# Patient Record
Sex: Female | Born: 1980 | Race: Black or African American | Hispanic: No | Marital: Married | Smoking: Heavy tobacco smoker
Health system: Southern US, Community
[De-identification: ages and names within clinical notes are randomized; demographics above are authoritative.]

## PROBLEM LIST (undated history)

## (undated) ENCOUNTER — Inpatient Hospital Stay (HOSPITAL_COMMUNITY): Payer: Self-pay

## (undated) DIAGNOSIS — O24419 Gestational diabetes mellitus in pregnancy, unspecified control: Secondary | ICD-10-CM

## (undated) DIAGNOSIS — Z789 Other specified health status: Secondary | ICD-10-CM

## (undated) DIAGNOSIS — B999 Unspecified infectious disease: Secondary | ICD-10-CM

## (undated) DIAGNOSIS — I499 Cardiac arrhythmia, unspecified: Secondary | ICD-10-CM

## (undated) DIAGNOSIS — N739 Female pelvic inflammatory disease, unspecified: Secondary | ICD-10-CM

## (undated) HISTORY — PX: TONSILLECTOMY: SUR1361

---

## 2011-08-07 ENCOUNTER — Emergency Department (HOSPITAL_COMMUNITY)
Admission: EM | Admit: 2011-08-07 | Discharge: 2011-08-07 | Disposition: A | Discharge status: Home / Self Care | Payer: Self-pay | Attending: Emergency Medicine | Admitting: Emergency Medicine

## 2011-08-07 ENCOUNTER — Encounter (HOSPITAL_COMMUNITY): Payer: Self-pay | Admitting: Emergency Medicine

## 2011-08-07 ENCOUNTER — Emergency Department (HOSPITAL_COMMUNITY): Payer: Self-pay

## 2011-08-07 DIAGNOSIS — O200 Threatened abortion: Secondary | ICD-10-CM | POA: Insufficient documentation

## 2011-08-07 DIAGNOSIS — R10819 Abdominal tenderness, unspecified site: Secondary | ICD-10-CM | POA: Insufficient documentation

## 2011-08-07 DIAGNOSIS — R109 Unspecified abdominal pain: Secondary | ICD-10-CM | POA: Insufficient documentation

## 2011-08-07 LAB — CBC WITH DIFFERENTIAL/PLATELET
Basophils Absolute: 0 10*3/uL (ref 0.0–0.1)
Basophils Relative: 0 % (ref 0–1)
Eosinophils Absolute: 0.2 10*3/uL (ref 0.0–0.7)
Eosinophils Relative: 2 % (ref 0–5)
HCT: 38.8 % (ref 36.0–46.0)
Hemoglobin: 13.4 g/dL (ref 12.0–15.0)
MCH: 31 pg (ref 26.0–34.0)
MCHC: 34.5 g/dL (ref 30.0–36.0)
MCV: 89.8 fL (ref 78.0–100.0)
Monocytes Absolute: 0.9 10*3/uL (ref 0.1–1.0)
Monocytes Relative: 8 % (ref 3–12)
RDW: 13.1 % (ref 11.5–15.5)

## 2011-08-07 LAB — URINALYSIS, ROUTINE W REFLEX MICROSCOPIC
Bilirubin Urine: NEGATIVE
Ketones, ur: 15 mg/dL — AB
Nitrite: NEGATIVE
Protein, ur: 30 mg/dL — AB
pH: 6.5 (ref 5.0–8.0)

## 2011-08-07 LAB — ABO/RH: ABO/RH(D): O POS

## 2011-08-07 LAB — URINE MICROSCOPIC-ADD ON

## 2011-08-07 LAB — BASIC METABOLIC PANEL
BUN: 8 mg/dL (ref 6–23)
Calcium: 8.8 mg/dL (ref 8.4–10.5)
Chloride: 101 mEq/L (ref 96–112)
Creatinine, Ser: 0.52 mg/dL (ref 0.50–1.10)
GFR calc Af Amer: >90 mL/min (ref >90)

## 2011-08-07 MED ORDER — SODIUM CHLORIDE 0.9 % IV BOLUS (SEPSIS)
1000.0000 mL | Freq: Once | INTRAVENOUS | Status: AC
  Administered 2011-08-07: 1000 mL via INTRAVENOUS

## 2011-08-07 MED ORDER — ONDANSETRON HCL 4 MG/2ML IJ SOLN
4.0000 mg | Freq: Once | INTRAMUSCULAR | Status: AC
  Administered 2011-08-07: 4 mg via INTRAVENOUS
  Filled 2011-08-07: qty 2

## 2011-08-07 MED ORDER — ACETAMINOPHEN-CODEINE #3 300-30 MG PO TABS: 1.0000 | ORAL_TABLET | Freq: Four times a day (QID) | ORAL | Status: DC | PRN

## 2011-08-07 MED ORDER — MORPHINE SULFATE 4 MG/ML IJ SOLN
4.0000 mg | Freq: Once | INTRAMUSCULAR | Status: AC
  Administered 2011-08-07: 4 mg via INTRAVENOUS
  Filled 2011-08-07: qty 1

## 2011-08-07 NOTE — ED Notes (Addendum)
Pt states she is having severe menstrual cramps. States large clots passing the past two days.  States she's been bleeding for the last 3 weeks. Has hx of endometriosis. States she took 800mg  ibuprofen and 1000mg  tylenol @ 0400. Pt tearful, rating pain 10/10 at present. Denies nausea, vomiting.

## 2011-08-07 NOTE — ED Notes (Signed)
Pt returned from ultrasound

## 2011-08-07 NOTE — ED Provider Notes (Signed)
Medical screening examination/treatment/procedure(s) were performed by non-physician practitioner and as supervising physician I was immediately available for consultation/collaboration.   Loren Racer, MD 08/07/11 [phone]

## 2011-08-07 NOTE — ED Provider Notes (Signed)
History     CSN: [id number]  Arrival date & time 08/07/11  5621   First MD Initiated Contact with Patient 08/07/11 0805      Chief Complaint  Patient presents with  . Abdominal Cramping    (Consider location/radiation/quality/duration/timing/severity/associated sxs/prior treatment) HPI Comments: Patient presents with abdominal pain which began this morning and is severe in nature. It is located to the suprapubic area and radiates across the lower abdomen. She has a history of endometriosis and menstrual cramps and states this feels somewhat similar but is considerably more severe in nature. She has irregular menses and began to have spotting on June 19 which has been persistent with heavy bleeding and passing of clots for the past 2-3 days. She believes that her prior menses was likely in April. She has had slight associated nausea without vomiting. Reports decreased appetite since yesterday. Denies changes in her bowel movements. Last vomiting was this morning and was normal for her. Denies urinary symptoms. No fever/chills.  Patient is a 31 y.o. female presenting with abdominal pain. The history is provided by the patient.  Abdominal Pain The primary symptoms of the illness include abdominal pain, nausea and vaginal bleeding. The primary symptoms of the illness do not include fever, fatigue, shortness of breath, vomiting, diarrhea, hematemesis, hematochezia, dysuria or vaginal discharge. The current episode started 3 to 5 hours ago. The onset of the illness was gradual. The problem has not changed since onset. The abdominal pain is located in the suprapubic region. The abdominal pain does not radiate.  Vaginal bleeding was first noticed more than 2 days ago. Vaginal bleeding is unchanged since it began. The quantity of blood was typical of menses.  The patient states that she believes she is currently not pregnant. The patient has not had a change in bowel habit. Additional symptoms associated  with the illness include anorexia. Symptoms associated with the illness do not include chills or back pain.    History reviewed. No pertinent past medical history.  Past Surgical History  Procedure Date  . Tonsillectomy     History reviewed. No pertinent family history.  History  Substance Use Topics  . Smoking status: Current Everyday Smoker  . Smokeless tobacco: Not on file  . Alcohol Use: Yes     occasionally    OB History    Grav Para Term Preterm Abortions TAB SAB Ect Mult Living                  Review of Systems  Constitutional: Negative for fever, chills and fatigue.  Respiratory: Negative for shortness of breath.   Cardiovascular: Negative for chest pain.  Gastrointestinal: Positive for nausea, abdominal pain and anorexia. Negative for vomiting, diarrhea, hematochezia and hematemesis.  Genitourinary: Positive for vaginal bleeding. Negative for dysuria and vaginal discharge.  Musculoskeletal: Negative for back pain.  All other systems reviewed and are negative.    Allergies  Review of patient's allergies indicates no known allergies.  Home Medications   Current Outpatient Rx  Name Route Sig Dispense Refill  . PAIN RELIEVER EXTRA STRENGTH PO Oral Take 1,000 mg by mouth every 6 (six) hours as needed. For pain    . IBUPROFEN 200 MG PO TABS Oral Take 800 mg by mouth every 8 (eight) hours as needed. For pain      LMP 07/18/2011  Physical Exam  Nursing note and vitals reviewed. Constitutional: She appears well-developed and well-nourished. No distress.       Tearful, uncomfortable appearing  HENT:  Head: Normocephalic and atraumatic.  Eyes:       Normal appearance  Neck: Normal range of motion.  Cardiovascular: Normal rate, regular rhythm and normal heart sounds.   Pulmonary/Chest: Effort normal and breath sounds normal. She exhibits no tenderness.  Abdominal: Soft.       Tender to palpation across lower/suprapubic abdomen, most pronounced on R side. No  guard or rebound. Negative Rovsing, jar testing. Questionably positive obturator.  Musculoskeletal: Normal range of motion.  Neurological: She is alert.  Skin: Skin is warm and dry. She is not diaphoretic.  Psychiatric: She has a normal mood and affect.    ED Course  Procedures (including critical care time)  Labs Reviewed  CBC WITH DIFFERENTIAL - Abnormal; Notable for the following:    WBC 11.5 (*)     Neutro Abs 8.8 (*)     All other components within normal limits  BASIC METABOLIC PANEL - Abnormal; Notable for the following:    Glucose, Bld 102 (*)     All other components within normal limits  URINALYSIS, ROUTINE W REFLEX MICROSCOPIC - Abnormal; Notable for the following:    APPearance CLOUDY (*)     Hgb urine dipstick LARGE (*)     Ketones, ur 15 (*)     Protein, ur 30 (*)     Leukocytes, UA LARGE (*)     All other components within normal limits  POCT PREGNANCY, URINE - Abnormal; Notable for the following:    Preg Test, Ur POSITIVE (*)     All other components within normal limits  HCG, QUANTITATIVE, PREGNANCY - Abnormal; Notable for the following:    hCG, Beta Chain, Quant, S 345 (*)     All other components within normal limits  URINE MICROSCOPIC-ADD ON - Abnormal; Notable for the following:    Squamous Epithelial / LPF FEW (*)     Bacteria, UA FEW (*)     All other components within normal limits  ABO/RH   US Ob Comp Less 14 Wks  08/07/2011  *RADIOLOGY REPORT*  Clinical Data: Abdominal pain and vaginal bleeding.  Possibly 12 weeks 1 day pregnant.  The patient is unsure of her last menstrual period.  Quantitative beta HCG 345.  OBSTETRIC <14 WK [place] AND TRANSVAGINAL OB US  Technique:  Both transabdominal and transvaginal ultrasound examinations were performed for complete evaluation of the gestation as well as the maternal uterus, adnexal regions, and pelvic cul-de-sac.  Transvaginal technique was performed to assess early pregnancy.  Comparison:  None.  Intrauterine  gestational sac:  Not definitely visualized. Yolk sac: Not visualized Embryo: Not visualized Cardiac Activity: Not visualized  Maternal uterus/adnexae: The endometrium in the lower uterine segment is thickened and heterogeneous, measuring 24.1 mm in maximum thickness.  There is an oval hypoechoic region within the thickened endometrium in this portion of the uterus.  The fundal endometrium is not as well visualized and measures approximately 15.4 mm in maximum thickness.  A trace amount of free peritoneal fluid is demonstrated.  The ovaries are poorly visualized and appear grossly normal.  No adnexal masses are seen.  IMPRESSION: Probable ongoing spontaneous abortion.  A follow-up obstetrical ultrasound is recommended in 2 weeks as well as correlation with serial quantitative beta HCG determinations.  Original Report Authenticated By: Darrol Angel, M.D.   US Ob Transvaginal  08/07/2011  *RADIOLOGY REPORT*  Clinical Data: Abdominal pain and vaginal bleeding.  Possibly 12 weeks 1 day pregnant.  The patient is unsure of  her last menstrual period.  Quantitative beta HCG 345.  OBSTETRIC <14 WK [place] AND TRANSVAGINAL OB US  Technique:  Both transabdominal and transvaginal ultrasound examinations were performed for complete evaluation of the gestation as well as the maternal uterus, adnexal regions, and pelvic cul-de-sac.  Transvaginal technique was performed to assess early pregnancy.  Comparison:  None.  Intrauterine gestational sac:  Not definitely visualized. Yolk sac: Not visualized Embryo: Not visualized Cardiac Activity: Not visualized  Maternal uterus/adnexae: The endometrium in the lower uterine segment is thickened and heterogeneous, measuring 24.1 mm in maximum thickness.  There is an oval hypoechoic region within the thickened endometrium in this portion of the uterus.  The fundal endometrium is not as well visualized and measures approximately 15.4 mm in maximum thickness.  A trace amount of free peritoneal  fluid is demonstrated.  The ovaries are poorly visualized and appear grossly normal.  No adnexal masses are seen.  IMPRESSION: Probable ongoing spontaneous abortion.  A follow-up obstetrical ultrasound is recommended in 2 weeks as well as correlation with serial quantitative beta HCG determinations.  Original Report Authenticated By: Darrol Angel, M.D.     1. Threatened abortion       MDM  Pt with sharp abd pain which began this AM and has not improved with ibuprofen. Believes this feels c/w cramping from menses. Pt tender to suprapubic area and RLQ. Normal vitals. She has +hcg but quant only 350. [place] does not show any obvious evidence of IUP; with pt's continued bleeding this likely represents threatened AB. She is RH+ so no need for rhogam. Remainder of labs nl. Instructed that she will need to follow up in several days to have quant repeated; [facility]. Reasons to return to ED for worsening, changing, or new abd pain discussed. Pt verbalized understanding and agreed to plan.  On repeat abd exam, abd soft, tenderness considerably improved.  Grant Fontana, PA-C 08/07/11 1339

## 2011-08-08 ENCOUNTER — Inpatient Hospital Stay (HOSPITAL_COMMUNITY)
Admission: AD | Admit: 2011-08-08 | Discharge: 2011-08-08 | Disposition: A | Discharge status: Home / Self Care | Payer: MEDICAID | Source: Ambulatory Visit | Attending: Family Medicine | Admitting: Family Medicine

## 2011-08-08 ENCOUNTER — Encounter (HOSPITAL_COMMUNITY): Payer: Self-pay

## 2011-08-08 DIAGNOSIS — O200 Threatened abortion: Secondary | ICD-10-CM | POA: Diagnosis present

## 2011-08-08 DIAGNOSIS — O030 Genital tract and pelvic infection following incomplete spontaneous abortion: Secondary | ICD-10-CM | POA: Insufficient documentation

## 2011-08-08 DIAGNOSIS — R1031 Right lower quadrant pain: Secondary | ICD-10-CM | POA: Insufficient documentation

## 2011-08-08 DIAGNOSIS — N390 Urinary tract infection, site not specified: Secondary | ICD-10-CM | POA: Diagnosis present

## 2011-08-08 LAB — URINALYSIS, ROUTINE W REFLEX MICROSCOPIC
Glucose, UA: NEGATIVE mg/dL
Ketones, ur: NEGATIVE mg/dL
Protein, ur: 30 mg/dL — AB
Urobilinogen, UA: 1 mg/dL (ref 0.0–1.0)

## 2011-08-08 LAB — WET PREP, GENITAL: Trich, Wet Prep: NONE SEEN

## 2011-08-08 LAB — URINE MICROSCOPIC-ADD ON

## 2011-08-08 MED ORDER — NITROFURANTOIN MONOHYD MACRO 100 MG PO CAPS: 100.0000 mg | ORAL_CAPSULE | ORAL | Status: DC

## 2011-08-08 MED ORDER — OXYCODONE-ACETAMINOPHEN 5-325 MG PO TABS: 1.0000 | ORAL_TABLET | Freq: Four times a day (QID) | ORAL | Status: AC | PRN

## 2011-08-08 MED ORDER — SULFAMETHOXAZOLE-TRIMETHOPRIM 800-160 MG PO TABS: 1.0000 | ORAL_TABLET | Freq: Two times a day (BID) | ORAL | Status: DC

## 2011-08-08 MED ORDER — OXYCODONE-ACETAMINOPHEN 5-325 MG PO TABS
2.0000 | ORAL_TABLET | ORAL | Status: AC
  Administered 2011-08-08: 2 via ORAL
  Filled 2011-08-08: qty 2

## 2011-08-08 NOTE — MAU Provider Note (Signed)
Kim Sullivan y.o.G1P0 @[redacted]w[redacted]d  by LMP Chief Complaint  Patient presents with  . Abdominal Pain     First Provider Initiated Contact with Patient 08/08/11 1024      SUBJECTIVE  HPI: Pt presents to MAU with continuing suprapubic and RLQ pain.  She presented to [facility] ED yesterday morning for these symptoms and had U/S and quantitative HCG indicating probable SAB.  She is having vaginal bleeding which is lighter today than yesterday, and reports irregular periods since adolescence.  She found about about pregnancy at ED yesterday.  She reports pain not well treated with Tylenol #3 she was prescribed and she has had urinary frequency for 2 days as well, without dysuria.  She denies LOF, vaginal itching/burning, h/a, dizziness, n/v, or fever/chills.    History reviewed. No pertinent past medical history. Past Surgical History  Procedure Date  . Tonsillectomy    History   Social History  . Marital Status: Single    Spouse Name: N/A    Number of Children: N/A  . Years of Education: N/A   Occupational History  . Not on file.   Social History Main Topics  . Smoking status: Current Everyday Smoker -- 0.5 packs/day  . Smokeless tobacco: Not on file  . Alcohol Use: Yes     occasionally  . Drug Use: No  . Sexually Active: Yes   Other Topics Concern  . Not on file   Social History Narrative  . No narrative on file   No current facility-administered medications on file prior to encounter.   Current Outpatient Prescriptions on File Prior to Encounter  Medication Sig Dispense Refill  . acetaminophen-codeine (TYLENOL #3) 300-30 MG per tablet Take 1 tablet by mouth every 6 (six) hours as needed for pain.  15 tablet  0   No Known Allergies  ROS: Pertinent items in HPI  OBJECTIVE Blood pressure 111/72, pulse 73, temperature 98.6 F (37 C), temperature source Oral, resp. rate 18, height 5' 10.5" (1.791 m), weight 146.058 kg (322 lb), last menstrual period 07/18/2011, SpO2  97.00%.  GENERAL: Well-developed, well-nourished female in no acute distress.  HEENT: Normocephalic, good dentition HEART: normal rate RESP: normal effort ABDOMEN: Soft, nontender EXTREMITIES: Nontender, no edema NEURO: Alert and oriented SPECULUM EXAM:Pelvic exam: Cervix pink, visually closed, without lesion, moderate amount dark red blood, no clots noted, vaginal walls and external genitalia normal Bimanual exam: Cervix 0/long/high, soft, anterior, neg CMT, uterus nontender, nonenlarged, adnexa without tenderness, enlargement, or mass  LAB RESULTS  Results for orders placed during the hospital encounter of 08/08/11 (from the past 24 hour(s))  URINALYSIS, ROUTINE W REFLEX MICROSCOPIC     Status: Abnormal   Collection Time   08/08/11 10:12 AM      Component Value Range   Color, Urine RED (*) YELLOW   APPearance HAZY (*) CLEAR   Specific Gravity, Urine 1.015  1.005 - 1.030   pH 6.5  5.0 - 8.0   Glucose, UA NEGATIVE  NEGATIVE mg/dL   Hgb urine dipstick LARGE (*) NEGATIVE   Bilirubin Urine NEGATIVE  NEGATIVE   Ketones, ur NEGATIVE  NEGATIVE mg/dL   Protein, ur 30 (*) NEGATIVE mg/dL   Urobilinogen, UA 1.0  0.0 - 1.0 mg/dL   Nitrite NEGATIVE  NEGATIVE   Leukocytes, UA MODERATE (*) NEGATIVE  URINE MICROSCOPIC-ADD ON     Status: Normal   Collection Time   08/08/11 10:12 AM      Component Value Range   Squamous Epithelial / LPF RARE  RARE   WBC, UA 3-6  <3 WBC/hpf   RBC / HPF TOO NUMEROUS TO COUNT  <3 RBC/hpf   Bacteria, UA RARE  RARE    IMAGING   ASSESSMENT Threatened SAB  Uncomplicated UTI   PLAN D/C home with bleeding precautions, infection precautions Bactrim DS BID x3 days, printed so pt can take to [facility] for lowest cost Urine sent for culture GC/Chlamydia cultures pending Return to MAU tomorrow morning after 9 am for repeat quantitative HCG  Medication List  As of 08/08/2011 12:26 PM   ASK your doctor about these medications         acetaminophen-codeine 300-30  MG per tablet   Commonly known as: TYLENOL #3   Take 1 tablet by mouth every 6 (six) hours as needed for pain.              LEFTWICH-KIRBY, Javar Eshbach 08/08/2011 12:26 PM

## 2011-08-08 NOTE — MAU Note (Signed)
Patient states she was seen at [facility] ED yesterday with a positive pregnancy test. Has continued to cramp all night with slight bleeding.

## 2011-08-09 ENCOUNTER — Inpatient Hospital Stay (HOSPITAL_COMMUNITY)
Admission: AD | Admit: 2011-08-09 | Discharge: 2011-08-09 | Disposition: A | Discharge status: Home / Self Care | Payer: Self-pay | Source: Ambulatory Visit | Attending: Obstetrics & Gynecology | Admitting: Obstetrics & Gynecology

## 2011-08-09 DIAGNOSIS — O039 Complete or unspecified spontaneous abortion without complication: Secondary | ICD-10-CM | POA: Insufficient documentation

## 2011-08-09 LAB — HCG, QUANTITATIVE, PREGNANCY: hCG, Beta Chain, Quant, S: 76 m[IU]/mL — ABNORMAL HIGH (ref <5)

## 2011-08-09 NOTE — MAU Provider Note (Signed)
  History     CSN: [id number]  Arrival date and time: 08/09/11 0959   None     No chief complaint on file.  HPI This is a 31 y.o. female at [redacted]w[redacted]d who was diagnosed with a probable SAB earlier this week. She is her for a Followup  HCG.  OB History    Grav Para Term Preterm Abortions TAB SAB Ect Mult Living   1               No past medical history on file.  Past Surgical History  Procedure Date  . Tonsillectomy     Family History  Problem Relation Age of Onset  . Heart disease Father   . Hypertension Father   . Hypertension Maternal Grandfather   . Heart disease Maternal Grandfather     History  Substance Use Topics  . Smoking status: Current Everyday Smoker -- 0.5 packs/day  . Smokeless tobacco: Not on file  . Alcohol Use: Yes     occasionally    Allergies: No Known Allergies  Prescriptions prior to admission  Medication Sig Dispense Refill  . oxyCODONE-acetaminophen (PERCOCET) 5-325 MG per tablet Take 1-2 tablets by mouth every 6 (six) hours as needed for pain.  15 tablet  0  . sulfamethoxazole-trimethoprim (BACTRIM DS,SEPTRA DS) 800-160 MG per tablet Take 1 tablet by mouth 2 (two) times daily.  6 tablet  0    ROS Cramps and bleeding. Bleeding is light  Physical Exam   Last menstrual period 07/18/2011.  Physical Exam  Constitutional: She is oriented to person, place, and time. She appears well-developed and well-nourished. No distress.  Cardiovascular: Normal rate.   Respiratory: Effort normal.  Neurological: She is alert and oriented to person, place, and time.  Skin: Skin is warm and dry.  Psychiatric: She has a normal mood and affect.  Pelvic exam not indicated.  MAU Course  Procedures  MDM Quant = 76 Results for orders placed during the hospital encounter of 08/09/11 (from the past 72 hour(s))  HCG, QUANTITATIVE, PREGNANCY     Status: Abnormal   Collection Time   08/09/11 10:10 AM      Component Value Range Comment   hCG, Beta Chain,  Quant, S 76 (*) <5 mIU/mL    Last quant 345  O +  Assessment and Plan  A:  SAB, probably complete       P:  Discussed with patient      Recommend repeat quant in one week. In clinic  [facility] 08/09/2011, 12:36 PM

## 2011-08-09 NOTE — MAU Provider Note (Signed)
Chart reviewed and agree with management and plan.  

## 2011-08-10 LAB — URINE CULTURE

## 2011-08-11 ENCOUNTER — Encounter (HOSPITAL_COMMUNITY): Payer: Self-pay | Admitting: *Deleted

## 2011-08-11 ENCOUNTER — Inpatient Hospital Stay (HOSPITAL_COMMUNITY)
Admission: AD | Admit: 2011-08-11 | Discharge: 2011-08-12 | Disposition: A | Discharge status: Home / Self Care | Payer: Self-pay | Source: Ambulatory Visit | Attending: Family Medicine | Admitting: Family Medicine

## 2011-08-11 DIAGNOSIS — N390 Urinary tract infection, site not specified: Secondary | ICD-10-CM

## 2011-08-11 DIAGNOSIS — R109 Unspecified abdominal pain: Secondary | ICD-10-CM | POA: Insufficient documentation

## 2011-08-11 DIAGNOSIS — O035 Genital tract and pelvic infection following complete or unspecified spontaneous abortion: Secondary | ICD-10-CM | POA: Insufficient documentation

## 2011-08-11 DIAGNOSIS — O039 Complete or unspecified spontaneous abortion without complication: Secondary | ICD-10-CM

## 2011-08-11 HISTORY — DX: Other specified health status: Z78.9

## 2011-08-11 LAB — URINE MICROSCOPIC-ADD ON

## 2011-08-11 LAB — URINALYSIS, ROUTINE W REFLEX MICROSCOPIC
Bilirubin Urine: NEGATIVE
Glucose, UA: 100 mg/dL — AB
Ketones, ur: NEGATIVE mg/dL
Protein, ur: 100 mg/dL — AB
Urobilinogen, UA: 2 mg/dL — ABNORMAL HIGH (ref 0.0–1.0)

## 2011-08-11 MED ORDER — KETOROLAC TROMETHAMINE 60 MG/2ML IM SOLN
60.0000 mg | Freq: Once | INTRAMUSCULAR | Status: AC
  Administered 2011-08-12: 60 mg via INTRAMUSCULAR
  Filled 2011-08-11: qty 2

## 2011-08-11 NOTE — MAU Provider Note (Signed)
History     CSN: [id number]  Arrival date and time: 08/11/11 2201   First Provider Initiated Contact with Patient 08/11/11 2323      Chief Complaint  Patient presents with  . Abdominal Cramping   HPI Kim Sullivan 31 y.o. [redacted]w[redacted]d Seen last on 08-08-11 and diagnosed with a miscarriage.  Returns today with continued vaginal bleeding and worsening abdominal pain.  Has been taking Percocet 2 tablets and has taken the 15 tablets since she was here previously.   Finished the Bactrim and still thinks she has a UTI.  Culture was inconclusive.  Will recollect UA today.  Hx of frequent UTIs as a child.  OB History    Grav Para Term Preterm Abortions TAB SAB Ect Mult Living   2 1 1  0 0 0 0 0 0 0      Past Medical History  Diagnosis Date  . No pertinent past medical history     Past Surgical History  Procedure Date  . Tonsillectomy     Family History  Problem Relation Age of Onset  . Heart disease Father   . Hypertension Father   . Hypertension Maternal Grandfather   . Heart disease Maternal Grandfather   . Other Neg Hx     History  Substance Use Topics  . Smoking status: Current Everyday Smoker -- 0.5 packs/day  . Smokeless tobacco: Not on file  . Alcohol Use: Yes     occasionally    Allergies: No Known Allergies  Prescriptions prior to admission  Medication Sig Dispense Refill  . acetaminophen-codeine (TYLENOL #3) 300-30 MG per tablet Take 1 tablet by mouth every 4 (four) hours as needed. pain      . ibuprofen (ADVIL,MOTRIN) 200 MG tablet Take 600 mg by mouth every 6 (six) hours as needed. pain      . oxyCODONE-acetaminophen (PERCOCET) 5-325 MG per tablet Take 1-2 tablets by mouth every 6 (six) hours as needed for pain.  15 tablet  0  . sulfamethoxazole-trimethoprim (BACTRIM DS,SEPTRA DS) 800-160 MG per tablet Take 1 tablet by mouth 2 (two) times daily.  6 tablet  0    Review of Systems  Constitutional: Negative for fever.  Gastrointestinal: Positive for abdominal  pain. Negative for nausea and vomiting.  Musculoskeletal: Negative for back pain.   Physical Exam   Blood pressure 120/71, pulse 76, temperature 98.3 F (36.8 C), temperature source Oral, resp. rate 20, height 5\' 11"  (1.803 m), weight 320 lb (145.151 kg), last menstrual period 07/18/2011.  Physical Exam  Nursing note and vitals reviewed. Constitutional: She is oriented to person, place, and time. She appears well-developed and well-nourished.  HENT:  Head: Normocephalic.  Eyes: EOM are normal.  Neck: Neck supple.  GI: Soft. There is tenderness. There is no rebound and no guarding.       Midline tenderness  Musculoskeletal: Normal range of motion.  Neurological: She is alert and oriented to person, place, and time.  Skin: Skin is warm and dry.  Psychiatric: She has a normal mood and affect.    MAU Course  Procedures  MDM Results for orders placed during the hospital encounter of 08/11/11 (from the past 24 hour(s))  URINALYSIS, ROUTINE W REFLEX MICROSCOPIC     Status: Abnormal   Collection Time   08/11/11 11:35 PM      Component Value Range   Color, Urine YELLOW  YELLOW   APPearance CLOUDY (*) CLEAR   Specific Gravity, Urine >1.030 (*) 1.005 - 1.030  pH 6.0  5.0 - 8.0   Glucose, UA 100 (*) NEGATIVE mg/dL   Hgb urine dipstick LARGE (*) NEGATIVE   Bilirubin Urine NEGATIVE  NEGATIVE   Ketones, ur NEGATIVE  NEGATIVE mg/dL   Protein, ur 578 (*) NEGATIVE mg/dL   Urobilinogen, UA 2.0 (*) 0.0 - 1.0 mg/dL   Nitrite POSITIVE (*) NEGATIVE   Leukocytes, UA SMALL (*) NEGATIVE  URINE MICROSCOPIC-ADD ON     Status: Abnormal   Collection Time   08/11/11 11:35 PM      Component Value Range   Squamous Epithelial / LPF FEW (*) RARE   WBC, UA 11-20  <3 WBC/hpf   RBC / HPF TOO NUMEROUS TO COUNT  <3 RBC/hpf   Bacteria, UA FEW (*) RARE   Urine culture pending  Assessment and Plan  UTI Miscarriage  Plan Will give Cipro 500 mg PO BID x 3 days for UTI Message sent to GYN clinic to  make sure she has an appointment for followup for repeat quant. Return if you develop fever, back pain or body aches. Drink at least 8 8-oz glasses of water every day. Expect to have some continued vaginal bleeding. rx Percocet one po q 4-6 hours PRN for severe pain rx Ibuprofen 600 mg PO q 4-6 hours for pain.     Erbie Arment 08/11/2011, 11:32 PM

## 2011-08-11 NOTE — MAU Note (Signed)
Was seen at [facility] and found out was pregnant. Came back in Weds due to pain and found out had uti. Returned Thurs for hormone level and found out was having miscarriage. Finished Bactrim yesterday. Pain in lower abd worse this afternoon and continues. Sharp and cramping pain.

## 2011-08-12 DIAGNOSIS — O039 Complete or unspecified spontaneous abortion without complication: Secondary | ICD-10-CM | POA: Diagnosis present

## 2011-08-12 MED ORDER — IBUPROFEN 600 MG PO TABS: 600.0000 mg | ORAL_TABLET | Freq: Four times a day (QID) | ORAL | Status: AC | PRN

## 2011-08-12 MED ORDER — CIPROFLOXACIN HCL 500 MG PO TABS: 500.0000 mg | ORAL_TABLET | Freq: Two times a day (BID) | ORAL | Status: AC

## 2011-08-12 MED ORDER — CIPROFLOXACIN HCL 500 MG PO TABS
500.0000 mg | ORAL_TABLET | Freq: Once | ORAL | Status: AC
  Administered 2011-08-12: 500 mg via ORAL
  Filled 2011-08-12: qty 1

## 2011-08-12 MED ORDER — OXYCODONE-ACETAMINOPHEN 5-325 MG PO TABS: 2.0000 | ORAL_TABLET | ORAL | Status: AC | PRN

## 2011-08-12 NOTE — MAU Provider Note (Signed)
Chart reviewed and agree with management and plan.  

## 2011-08-12 NOTE — Progress Notes (Signed)
Written and verbal d/c instructions given and understanding voiced. 

## 2011-08-14 LAB — URINE CULTURE: Colony Count: 25000

## 2011-08-17 ENCOUNTER — Other Ambulatory Visit: Payer: Self-pay

## 2011-08-17 DIAGNOSIS — O039 Complete or unspecified spontaneous abortion without complication: Secondary | ICD-10-CM

## 2012-01-30 NOTE — L&D Delivery Note (Signed)
Delivery Note At 6:08 AM a viable female was delivered via Vaginal, Spontaneous Delivery (Presentation: ; Occiput Anterior).  APGAR: 3, 9; weight 6 lb 15.1 oz (3150 g).   Placenta status: Intact, Spontaneous.  Cord: 3 vessels with the following complications: None.  Cord pH: 7.34 (venous, not enough blood for arterial)  Anesthesia: Epidural  Episiotomy: None Lacerations: 2nd degree;Perineal Suture Repair: 3.0 vicryl Est. Blood Loss (mL): 350  Mom to postpartum.  Baby to nursery-stable.   Called to standby for delivery for [facility] who was in another delivery. Pt pushed with good maternal effort to deliver a liveborn female.  Baby had a nuchal cord x1 and a tight fit through the introitus with the left arm rotated back behind his back.  Baby was delivered through nuchal. HR was down for <71min prior to delivery.  Baby came out with poor tone and code apgar was called.  Cord was cut and clamped and baby was placed in the warmer and NICU arrived shortly after.  Venous cord pH was 7.34.  Placenta delivered spontaneously intact with 3 vessel cord.  Uterus required bimanual massage to firm up as well as pitocin. 2nd degree perineal repaired without complications. Baby stayed with Mom in room.     Damean Poffenberger L 10/02/2012, 7:37 AM

## 2012-04-13 ENCOUNTER — Inpatient Hospital Stay (HOSPITAL_COMMUNITY)
Admission: AD | Admit: 2012-04-13 | Discharge: 2012-04-13 | Disposition: A | Discharge status: Home / Self Care | Payer: Self-pay | Source: Ambulatory Visit | Attending: Obstetrics & Gynecology | Admitting: Obstetrics & Gynecology

## 2012-04-13 ENCOUNTER — Encounter (HOSPITAL_COMMUNITY): Payer: Self-pay | Admitting: *Deleted

## 2012-04-13 DIAGNOSIS — O2390 Unspecified genitourinary tract infection in pregnancy, unspecified trimester: Secondary | ICD-10-CM | POA: Insufficient documentation

## 2012-04-13 DIAGNOSIS — O98819 Other maternal infectious and parasitic diseases complicating pregnancy, unspecified trimester: Secondary | ICD-10-CM | POA: Insufficient documentation

## 2012-04-13 DIAGNOSIS — N390 Urinary tract infection, site not specified: Secondary | ICD-10-CM

## 2012-04-13 DIAGNOSIS — A5901 Trichomonal vulvovaginitis: Secondary | ICD-10-CM

## 2012-04-13 DIAGNOSIS — O26859 Spotting complicating pregnancy, unspecified trimester: Secondary | ICD-10-CM | POA: Insufficient documentation

## 2012-04-13 DIAGNOSIS — O200 Threatened abortion: Secondary | ICD-10-CM

## 2012-04-13 HISTORY — DX: Cardiac arrhythmia, unspecified: I49.9

## 2012-04-13 LAB — URINALYSIS, ROUTINE W REFLEX MICROSCOPIC
Glucose, UA: NEGATIVE mg/dL
Ketones, ur: NEGATIVE mg/dL
Protein, ur: NEGATIVE mg/dL
pH: 6 (ref 5.0–8.0)

## 2012-04-13 LAB — WET PREP, GENITAL
Clue Cells Wet Prep HPF POC: NONE SEEN
Yeast Wet Prep HPF POC: NONE SEEN

## 2012-04-13 LAB — URINE MICROSCOPIC-ADD ON

## 2012-04-13 MED ORDER — METRONIDAZOLE 500 MG PO TABS
2000.0000 mg | ORAL_TABLET | Freq: Once | ORAL | Status: AC
  Administered 2012-04-13: 2000 mg via ORAL
  Filled 2012-04-13: qty 4

## 2012-04-13 MED ORDER — ONDANSETRON 8 MG PO TBDP
8.0000 mg | ORAL_TABLET | Freq: Once | ORAL | Status: AC
  Administered 2012-04-13: 8 mg via ORAL
  Filled 2012-04-13: qty 1

## 2012-04-13 MED ORDER — NITROFURANTOIN MONOHYD MACRO 100 MG PO CAPS: 100.0000 mg | ORAL_CAPSULE | Freq: Two times a day (BID) | ORAL | Status: DC

## 2012-04-13 NOTE — MAU Note (Signed)
Pt reports she went to [facility] today  And wiped and saw some oink/red dishcarge on tissue. Denies pain or cramping at this time.

## 2012-04-13 NOTE — MAU Provider Note (Signed)
History     CSN: [id number]  Arrival date and time: 04/13/12 1334   First Provider Initiated Contact with Patient 04/13/12 1446      Chief Complaint  Patient presents with  . Vaginal Bleeding   HPI 32 y.o. G3P1010 at [redacted]w[redacted]d with spotting with wiping x 1 today. No pain. Plans prenatal care with [facility].    Past Medical History  Diagnosis Date  . No pertinent past medical history   . Irregular heart beat     Past Surgical History  Procedure Laterality Date  . Tonsillectomy      Family History  Problem Relation Age of Onset  . Heart disease Father   . Hypertension Father   . Hypertension Maternal Grandfather   . Heart disease Maternal Grandfather   . Other Neg Hx     History  Substance Use Topics  . Smoking status: Former Smoker -- 0.50 packs/day    Quit date: 01/31/2012  . Smokeless tobacco: Not on file  . Alcohol Use: No     Comment: occasionally/'not since pregnancy    Allergies: No Known Allergies  Prescriptions prior to admission  Medication Sig Dispense Refill  . Prenatal Vit-Fe Fumarate-FA (PRENATAL MULTIVITAMIN) TABS Take 1 tablet by mouth daily at 12 noon.        Review of Systems  Constitutional: Negative.   Respiratory: Negative.   Cardiovascular: Negative.   Gastrointestinal: Negative for nausea, vomiting, abdominal pain, diarrhea and constipation.  Genitourinary: Negative for dysuria, urgency, frequency, hematuria and flank pain.       + spotting   Musculoskeletal: Negative.   Neurological: Negative.   Psychiatric/Behavioral: Negative.    Physical Exam   Blood pressure 112/71, pulse 95, temperature 97.9 F (36.6 C), temperature source Oral, resp. rate 18, height 5\' 11"  (1.803 m), weight 343 lb 3.2 oz (155.674 kg), last menstrual period 01/17/2012, unknown if currently breastfeeding.  Physical Exam  Nursing note and vitals reviewed. Constitutional: She is oriented to person, place, and time. She appears well-developed and well-nourished.  No distress.  Cardiovascular: Normal rate.   Respiratory: Effort normal.  GI: Soft. There is no tenderness.  Genitourinary: Uterus is not tender. Cervix exhibits no motion tenderness and no discharge. Right adnexum displays no mass, no tenderness and no fullness. Left adnexum displays no mass, no tenderness and no fullness. There is bleeding (scant pinkish mucous at cervix) around the vagina. Vaginal discharge (white, malodorous) found.  Exam limited by body habitus   Musculoskeletal: Normal range of motion.  Neurological: She is alert and oriented to person, place, and time.  Skin: Skin is warm and dry.  Psychiatric: She has a normal mood and affect.   + FHR 140 by doppler MAU Course  Procedures Results for orders placed during the hospital encounter of 04/13/12 (from the past 24 hour(s))  URINALYSIS, ROUTINE W REFLEX MICROSCOPIC     Status: Abnormal   Collection Time    04/13/12  2:15 PM      Result Value Range   Color, Urine YELLOW  YELLOW   APPearance CLEAR  CLEAR   Specific Gravity, Urine 1.025  1.005 - 1.030   pH 6.0  5.0 - 8.0   Glucose, UA NEGATIVE  NEGATIVE mg/dL   Hgb urine dipstick LARGE (*) NEGATIVE   Bilirubin Urine NEGATIVE  NEGATIVE   Ketones, ur NEGATIVE  NEGATIVE mg/dL   Protein, ur NEGATIVE  NEGATIVE mg/dL   Urobilinogen, UA 0.2  0.0 - 1.0 mg/dL   Nitrite NEGATIVE  NEGATIVE  Leukocytes, UA MODERATE (*) NEGATIVE  URINE MICROSCOPIC-ADD ON     Status: Abnormal   Collection Time    04/13/12  2:15 PM      Result Value Range   Squamous Epithelial / LPF MANY (*) RARE   WBC, UA 21-50  <3 WBC/hpf   RBC / HPF 7-10  <3 RBC/hpf   Bacteria, UA RARE  RARE   Urine-Other MUCOUS PRESENT    WET PREP, GENITAL     Status: Abnormal   Collection Time    04/13/12  2:45 PM      Result Value Range   Yeast Wet Prep HPF POC NONE SEEN  NONE SEEN   Trich, Wet Prep FEW (*) NONE SEEN   Clue Cells Wet Prep HPF POC NONE SEEN  NONE SEEN   WBC, Wet Prep HPF POC FEW (*) NONE SEEN     Flagyl 2000 mg PO in MAU  Assessment and Plan   1. Threatened abortion   2. UTI (lower urinary tract infection)   3. Trichomonas vaginalis (TV) infection       Medication List    TAKE these medications       nitrofurantoin (macrocrystal-monohydrate) 100 MG capsule  Commonly known as:  MACROBID  Take 1 capsule (100 mg total) by mouth 2 (two) times daily.     prenatal multivitamin Tabs  Take 1 tablet by mouth daily at 12 noon.            Follow-up Information   Schedule an appointment as soon as possible for a visit with [facility].   Contact information:   [address] [place] [address] [phone]        Gerber Penza 04/13/2012, 3:30 PM

## 2012-04-14 LAB — GC/CHLAMYDIA PROBE AMP: CT Probe RNA: NEGATIVE

## 2012-04-16 NOTE — MAU Provider Note (Signed)
Attestation of Attending Supervision of Advanced Practitioner (CNM/NP): Evaluation and management procedures were performed by the Advanced Practitioner under my supervision and collaboration.  I have reviewed the Advanced Practitioner's note and chart, and I agree with the management and plan.  Kim Sullivan, Kim Sullivan 9:44 AM

## 2012-04-17 ENCOUNTER — Encounter (HOSPITAL_COMMUNITY): Payer: Self-pay | Admitting: *Deleted

## 2012-04-17 ENCOUNTER — Inpatient Hospital Stay (HOSPITAL_COMMUNITY): Payer: Medicaid Other

## 2012-04-17 ENCOUNTER — Inpatient Hospital Stay (HOSPITAL_COMMUNITY)
Admission: AD | Admit: 2012-04-17 | Discharge: 2012-04-17 | Disposition: A | Discharge status: Home / Self Care | Payer: Medicaid Other | Source: Ambulatory Visit | Attending: Obstetrics & Gynecology | Admitting: Obstetrics & Gynecology

## 2012-04-17 DIAGNOSIS — O4690 Antepartum hemorrhage, unspecified, unspecified trimester: Secondary | ICD-10-CM

## 2012-04-17 DIAGNOSIS — O4692 Antepartum hemorrhage, unspecified, second trimester: Secondary | ICD-10-CM

## 2012-04-17 DIAGNOSIS — O209 Hemorrhage in early pregnancy, unspecified: Secondary | ICD-10-CM | POA: Insufficient documentation

## 2012-04-17 LAB — CBC
HCT: 36.2 % (ref 36.0–46.0)
MCH: 30.7 pg (ref 26.0–34.0)
MCHC: 33.7 g/dL (ref 30.0–36.0)
MCV: 91.2 fL (ref 78.0–100.0)
RDW: 13.4 % (ref 11.5–15.5)

## 2012-04-17 LAB — WET PREP, GENITAL
Clue Cells Wet Prep HPF POC: NONE SEEN
Yeast Wet Prep HPF POC: NONE SEEN

## 2012-04-17 LAB — URINALYSIS, ROUTINE W REFLEX MICROSCOPIC
Glucose, UA: NEGATIVE mg/dL
Ketones, ur: NEGATIVE mg/dL
Protein, ur: NEGATIVE mg/dL
Urobilinogen, UA: 0.2 mg/dL (ref 0.0–1.0)

## 2012-04-17 LAB — URINE MICROSCOPIC-ADD ON

## 2012-04-17 NOTE — MAU Provider Note (Signed)
History     CSN: [id number]  Arrival date and time: 04/17/12 1034   First Provider Initiated Contact with Patient 04/17/12 1102      Chief Complaint  Patient presents with  . Vaginal Bleeding   HPI Ms. Kim Sullivan is a 32 y.o. G3P1010 at [redacted]w[redacted]d who presents to MAU today with complaint of vaginal bleeding. The patient was seen in MAU on Sunday and diagnosed with trichomonas and UTI. She was treated for trichomonas here on Sunday and given Rx for Macrobid which she is still taking for UTI. She had a faint pink discharge at that time. This morning around 10 am she noted bleeding in her underwear and when she wiped. She denies other abnormal discharge, abdominal pain other than occasional sharp pains that are minimal and do not last long. She denies fever, or UTI symptoms. She has had some nausea without vomiting.   OB History   Grav Para Term Preterm Abortions TAB SAB Ect Mult Living   3 1 1  0 1 0 1 0 0 0      Past Medical History  Diagnosis Date  . No pertinent past medical history   . Irregular heart beat     Past Surgical History  Procedure Laterality Date  . Tonsillectomy      Family History  Problem Relation Age of Onset  . Heart disease Father   . Hypertension Father   . Hypertension Maternal Grandfather   . Heart disease Maternal Grandfather   . Other Neg Hx     History  Substance Use Topics  . Smoking status: Former Smoker -- 0.50 packs/day    Quit date: 01/31/2012  . Smokeless tobacco: Not on file  . Alcohol Use: No     Comment: occasionally/'not since pregnancy    Allergies: No Known Allergies  Prescriptions prior to admission  Medication Sig Dispense Refill  . nitrofurantoin, macrocrystal-monohydrate, (MACROBID) 100 MG capsule Take 100 mg by mouth 2 (two) times daily.      . Prenatal Vit-Fe Fumarate-FA (PRENATAL MULTIVITAMIN) TABS Take 1 tablet by mouth daily with breakfast.       . [DISCONTINUED] nitrofurantoin, macrocrystal-monohydrate,  (MACROBID) 100 MG capsule Take 1 capsule (100 mg total) by mouth 2 (two) times daily.  14 capsule  0    Review of Systems  Constitutional: Negative for fever and malaise/fatigue.  Gastrointestinal: Positive for nausea. Negative for vomiting, abdominal pain, diarrhea and constipation.  Genitourinary: Negative for dysuria, urgency and frequency.       + vaginal bleeding Neg - abnormal discharge  Musculoskeletal: Negative for back pain.  Neurological: Negative for dizziness.   Physical Exam   Blood pressure 118/75, pulse 85, temperature 98.3 F (36.8 C), temperature source Oral, resp. rate 20, height 5\' 9"  (1.753 m), weight 337 lb (152.862 kg), last menstrual period 01/17/2012.  Physical Exam  Constitutional: She is oriented to person, place, and time. She appears well-developed and well-nourished. No distress.  HENT:  Head: Normocephalic and atraumatic.  Cardiovascular: Normal rate, regular rhythm and normal heart sounds.   Respiratory: Effort normal and breath sounds normal. No respiratory distress.  GI: Soft. Bowel sounds are normal. She exhibits no distension and no mass. There is tenderness (mild LLQ tenderness to palpation). There is no rebound and no guarding.  Genitourinary: Vagina normal. Uterus is not enlarged and not tender. Cervix exhibits discharge (small blood clot at the cervical os. minimal blood in the vagina). Cervix exhibits no motion tenderness and no friability. Right adnexum displays  no mass and no tenderness. Left adnexum displays no mass and no tenderness.  Neurological: She is alert and oriented to person, place, and time.  Skin: Skin is warm and dry. No erythema.  Psychiatric: She has a normal mood and affect.  Cervix: closed, long, thick and posterior  Results for orders placed during the hospital encounter of 04/17/12 (from the past 24 hour(s))  WET PREP, GENITAL     Status: Abnormal   Collection Time    04/17/12 11:15 AM      Result Value Range   Yeast Wet  Prep HPF POC NONE SEEN  NONE SEEN   Trich, Wet Prep NONE SEEN  NONE SEEN   Clue Cells Wet Prep HPF POC NONE SEEN  NONE SEEN   WBC, Wet Prep HPF POC FEW (*) NONE SEEN  URINALYSIS, ROUTINE W REFLEX MICROSCOPIC     Status: Abnormal   Collection Time    04/17/12 11:15 AM      Result Value Range   Color, Urine YELLOW  YELLOW   APPearance CLEAR  CLEAR   Specific Gravity, Urine 1.025  1.005 - 1.030   pH 6.0  5.0 - 8.0   Glucose, UA NEGATIVE  NEGATIVE mg/dL   Hgb urine dipstick LARGE (*) NEGATIVE   Bilirubin Urine NEGATIVE  NEGATIVE   Ketones, ur NEGATIVE  NEGATIVE mg/dL   Protein, ur NEGATIVE  NEGATIVE mg/dL   Urobilinogen, UA 0.2  0.0 - 1.0 mg/dL   Nitrite NEGATIVE  NEGATIVE   Leukocytes, UA NEGATIVE  NEGATIVE  URINE MICROSCOPIC-ADD ON     Status: Abnormal   Collection Time    04/17/12 11:15 AM      Result Value Range   Squamous Epithelial / LPF MANY (*) RARE   RBC / HPF 0-2  <3 RBC/hpf   Bacteria, UA RARE  RARE  CBC     Status: Abnormal   Collection Time    04/17/12 11:24 AM      Result Value Range   WBC 13.5 (*) 4.0 - 10.5 K/uL   RBC 3.97  3.87 - 5.11 MIL/uL   Hemoglobin 12.2  12.0 - 15.0 g/dL   HCT 01.0  27.2 - 53.6 %   MCV 91.2  78.0 - 100.0 fL   MCH 30.7  26.0 - 34.0 pg   MCHC 33.7  30.0 - 36.0 g/dL   RDW 64.4  03.4 - 74.2 %   Platelets 236  150 - 400 K/uL     MAU Course  Procedures None  MDM Patient has recent trichomonas and UTI infections. No active bleeding on exam. Cervix is closed. +FHTs and normal [place] with no evidence of placental abnormalities or subchorionic hemorrhage.   Assessment and Plan  A: Bleeding in pregnancy  P: Discharge home Patient reassured that this is most likely a results of cervical friability from recent infection Patient plans to start prenatal care at [facility] after 04/29/12 when insurance is active Patient may return to MAU if her condition were to change or worsen  Freddi Starr, PA-C  04/17/2012, 11:02 AM

## 2012-04-17 NOTE — MAU Note (Signed)
Noted blood when went to restroom. Was here on Sunday, dx with a UTI. takiing the antibiotics.

## 2012-04-18 LAB — GC/CHLAMYDIA PROBE AMP: CT Probe RNA: NEGATIVE

## 2012-05-07 LAB — OB RESULTS CONSOLE GC/CHLAMYDIA
Chlamydia: NEGATIVE
Gonorrhea: NEGATIVE

## 2012-05-07 LAB — OB RESULTS CONSOLE ABO/RH: RH Type: POSITIVE

## 2012-06-25 ENCOUNTER — Inpatient Hospital Stay (HOSPITAL_COMMUNITY)
Admission: AD | Admit: 2012-06-25 | Discharge: 2012-06-25 | Disposition: A | Discharge status: Home / Self Care | Payer: BC Managed Care – PPO | Source: Ambulatory Visit | Attending: Obstetrics and Gynecology | Admitting: Obstetrics and Gynecology

## 2012-06-25 ENCOUNTER — Encounter (HOSPITAL_COMMUNITY): Payer: Self-pay

## 2012-06-25 DIAGNOSIS — O368190 Decreased fetal movements, unspecified trimester, not applicable or unspecified: Secondary | ICD-10-CM | POA: Insufficient documentation

## 2012-06-25 LAB — URINE MICROSCOPIC-ADD ON

## 2012-06-25 LAB — URINALYSIS, ROUTINE W REFLEX MICROSCOPIC
Ketones, ur: NEGATIVE mg/dL
Nitrite: NEGATIVE
Protein, ur: NEGATIVE mg/dL

## 2012-06-25 NOTE — MAU Note (Signed)
Pt states that she has not felt baby move as much today. Tried drinking something cold and that she still did not feel anything. Denies LOF and vaginal bleeding

## 2012-06-26 LAB — URINE CULTURE: Colony Count: >=100000

## 2012-09-04 ENCOUNTER — Inpatient Hospital Stay (HOSPITAL_COMMUNITY)
Admission: AD | Admit: 2012-09-04 | Discharge: 2012-09-04 | Disposition: A | Discharge status: Home / Self Care | Payer: Medicaid Other | Source: Ambulatory Visit | Attending: Obstetrics and Gynecology | Admitting: Obstetrics and Gynecology

## 2012-09-04 ENCOUNTER — Encounter (HOSPITAL_COMMUNITY): Payer: Self-pay | Admitting: *Deleted

## 2012-09-04 DIAGNOSIS — O479 False labor, unspecified: Secondary | ICD-10-CM | POA: Insufficient documentation

## 2012-09-04 DIAGNOSIS — O368190 Decreased fetal movements, unspecified trimester, not applicable or unspecified: Secondary | ICD-10-CM | POA: Insufficient documentation

## 2012-09-04 NOTE — MAU Note (Signed)
Very active this morning, but wasn't moving when at the doctors.

## 2012-09-04 NOTE — MAU Provider Note (Signed)
History   31yo G3P1010 at [redacted]w[redacted]d presents for NST for decreased FM.  Denies VB, UCs, recent fever, resp or GI c/o's, UTI or PIH s/s.  Chief Complaint  Patient presents with  . Decreased Fetal Movement    OB History   Grav Para Term Preterm Abortions TAB SAB Ect Mult Living   3 1 1  0 1 0 1 0 0 0      Past Medical History  Diagnosis Date  . No pertinent past medical history   . Irregular heart beat     Past Surgical History  Procedure Laterality Date  . Tonsillectomy      Family History  Problem Relation Age of Onset  . Heart disease Father   . Hypertension Father   . Hypertension Maternal Grandfather   . Heart disease Maternal Grandfather   . Other Neg Hx     History  Substance Use Topics  . Smoking status: Former Smoker -- 0.50 packs/day    Quit date: 01/31/2012  . Smokeless tobacco: Not on file  . Alcohol Use: No     Comment: occasionally/'not since pregnancy    Allergies: No Known Allergies  Prescriptions prior to admission  Medication Sig Dispense Refill  . acetaminophen (TYLENOL) 500 MG tablet Take 500 mg by mouth every 6 (six) hours as needed for pain (back pain).      Marland Kitchen diphenhydrAMINE (BENADRYL) 25 mg capsule Take 25 mg by mouth every 6 (six) hours as needed for itching.      . Prenatal Vit-Fe Fumarate-FA (PRENATAL MULTIVITAMIN) TABS Take 1 tablet by mouth daily with breakfast.         ROS: see HPI above, all other systems are negative   Physical Exam   Blood pressure 132/63, pulse 113, temperature 98 F (36.7 C), temperature source Oral, resp. rate 20, last menstrual period 01/17/2012.  Chest: Clear Heart: RRR Abdomen: gravid, NT Extremities: WNL   FHT: Reactive NST UCs: None   ED Course  IUP at [redacted]w[redacted]d Decreased FM  NST - reactive  D/c home with precautions [facility] instructions F/u at already scheduled appointment on 8/14   Haroldine Laws CNM, MSN 09/04/2012 6:44 PM

## 2012-09-04 NOTE — MAU Note (Signed)
Pt states baby has been very active, but did not move in the doctors office. Pt states she has felt the baby move a lot this morninig

## 2012-09-09 ENCOUNTER — Inpatient Hospital Stay (HOSPITAL_COMMUNITY)
Admission: AD | Admit: 2012-09-09 | Discharge: 2012-09-09 | Disposition: A | Discharge status: Home / Self Care | Payer: Medicaid Other | Source: Ambulatory Visit | Attending: Obstetrics and Gynecology | Admitting: Obstetrics and Gynecology

## 2012-09-09 ENCOUNTER — Inpatient Hospital Stay (HOSPITAL_COMMUNITY): Payer: Medicaid Other

## 2012-09-09 ENCOUNTER — Encounter (HOSPITAL_COMMUNITY): Payer: Self-pay | Admitting: *Deleted

## 2012-09-09 DIAGNOSIS — O368190 Decreased fetal movements, unspecified trimester, not applicable or unspecified: Secondary | ICD-10-CM | POA: Insufficient documentation

## 2012-09-09 HISTORY — DX: Unspecified infectious disease: B99.9

## 2012-09-09 HISTORY — DX: Female pelvic inflammatory disease, unspecified: N73.9

## 2012-09-09 NOTE — MAU Provider Note (Signed)
History   32 yo G3P1010 at [redacted]w[redacted]d presents with decreased FM.  Pt followed [facility] instructions and felt only 6 movements in 2 hours this morning.  Last night pt attempted [facility] but fell asleep. Weekly BPP being done d/t being morbidly obese.  Sent over from the office on 8/7 for 6/8 BPP and non reactive NST for further evaluation.  Denies VB, UCs, LOF, recent fever, resp or GI c/o's, UTI or PIH s/s.   Chief Complaint  Patient presents with  . Decreased Fetal Movement    OB History   Grav Para Term Preterm Abortions TAB SAB Ect Mult Living   3 1 1  0 1 0 1 0 0 0      Past Medical History  Diagnosis Date  . No pertinent past medical history   . Irregular heart beat   . Infection     UTI  . PID (pelvic inflammatory disease)     Past Surgical History  Procedure Laterality Date  . Tonsillectomy      Family History  Problem Relation Age of Onset  . Heart disease Father   . Hypertension Father   . Hypertension Maternal Grandfather   . Heart disease Maternal Grandfather   . Other Neg Hx   . Lupus Mother   . Lupus Maternal Aunt   . Heart disease Maternal Grandmother   . COPD Paternal Grandmother     liver and lung  . Heart disease Paternal Grandfather     History  Substance Use Topics  . Smoking status: Former Smoker -- 0.50 packs/day    Quit date: 01/31/2012  . Smokeless tobacco: Never Used  . Alcohol Use: No     Comment: occasionally/'not since pregnancy    Allergies: No Known Allergies  Prescriptions prior to admission  Medication Sig Dispense Refill  . acetaminophen (TYLENOL) 500 MG tablet Take 500 mg by mouth every 6 (six) hours as needed for pain (back pain).      Marland Kitchen diphenhydrAMINE (BENADRYL) 25 mg capsule Take 25 mg by mouth every 6 (six) hours as needed for itching.      . Prenatal Vit-Fe Fumarate-FA (PRENATAL MULTIVITAMIN) TABS Take 1 tablet by mouth daily with breakfast.         ROS: see HPI above, all other systems are negative   Physical Exam   Blood  pressure 114/77, pulse 107, temperature 97.7 F (36.5 C), temperature source Oral, resp. rate 20, last menstrual period 01/17/2012.  Chest: Clear Heart: RRR Abdomen: gravid, NT Extremities: WNL  Pelvic:  FHT: see below UCs: none  ED Course  IUP at [redacted]w[redacted]d Decreased FM  NST - Reactive NST BPP - 8/10 - 2 off for breathing  D/c home with precautions [facility] instructions given Keep already scheduled ROB with BPP this Thursday   Haroldine Laws CNM, MSN 09/09/2012 1:36 PM

## 2012-09-09 NOTE — MAU Note (Signed)
States usually baby is active in the morning, just wasn't today.

## 2012-09-11 ENCOUNTER — Inpatient Hospital Stay (HOSPITAL_COMMUNITY)
Admission: AD | Admit: 2012-09-11 | Discharge: 2012-09-11 | Disposition: A | Discharge status: Home / Self Care | Payer: Medicaid Other | Source: Ambulatory Visit | Attending: Obstetrics and Gynecology | Admitting: Obstetrics and Gynecology

## 2012-09-11 ENCOUNTER — Encounter (HOSPITAL_COMMUNITY): Payer: Self-pay | Admitting: *Deleted

## 2012-09-11 DIAGNOSIS — O368190 Decreased fetal movements, unspecified trimester, not applicable or unspecified: Secondary | ICD-10-CM | POA: Insufficient documentation

## 2012-09-11 DIAGNOSIS — O4700 False labor before 37 completed weeks of gestation, unspecified trimester: Secondary | ICD-10-CM | POA: Insufficient documentation

## 2012-09-11 NOTE — MAU Note (Signed)
Pt states "they were not able to see movement in office

## 2012-09-11 NOTE — MAU Provider Note (Addendum)
History   32 yo G3P1010 at [redacted]w[redacted]d was sent from the office with a BPP of 6/8 for an NST.  Denies VB, UCs, LOF, recent fever, resp or GI c/o's, UTI or PIH s/s. GFM.  Chief Complaint  Patient presents with  . Decreased Fetal Movement    OB History   Grav Para Term Preterm Abortions TAB SAB Ect Mult Living   3 1 1  0 1 0 1 0 0 0      Past Medical History  Diagnosis Date  . No pertinent past medical history   . Irregular heart beat   . Infection     UTI  . PID (pelvic inflammatory disease)     Past Surgical History  Procedure Laterality Date  . Tonsillectomy      Family History  Problem Relation Age of Onset  . Heart disease Father   . Hypertension Father   . Hypertension Maternal Grandfather   . Heart disease Maternal Grandfather   . Other Neg Hx   . Lupus Mother   . Lupus Maternal Aunt   . Heart disease Maternal Grandmother   . COPD Paternal Grandmother     liver and lung  . Heart disease Paternal Grandfather     History  Substance Use Topics  . Smoking status: Former Smoker -- 0.50 packs/day    Quit date: 01/31/2012  . Smokeless tobacco: Never Used  . Alcohol Use: No     Comment: occasionally/'not since pregnancy    Allergies: No Known Allergies  Prescriptions prior to admission  Medication Sig Dispense Refill  . acetaminophen (TYLENOL) 500 MG tablet Take 500 mg by mouth every 6 (six) hours as needed for pain (back pain).      Marland Kitchen diphenhydrAMINE (BENADRYL) 25 mg capsule Take 25 mg by mouth every 6 (six) hours as needed for itching.      . Prenatal Vit-Fe Fumarate-FA (PRENATAL MULTIVITAMIN) TABS Take 1 tablet by mouth daily with breakfast.         ROS: see HPI above, all other systems are negative   Physical Exam   Blood pressure 123/66, pulse 104, temperature 98.4 F (36.9 C), temperature source Oral, resp. rate 18, last menstrual period 01/17/2012.  Chest: Clear Heart: RRR Abdomen: gravid, NT Extremities: WNL   FHT: Reactive NST UCs:  ED  Course  IUP at [redacted]w[redacted]d BPP 6/8 in office  Reactive NST  D/c home with precautions [facility] instructions F/u at [facility] in 1 week    Haroldine Laws CNM, MSN 09/11/2012 6:33 PM

## 2012-09-30 ENCOUNTER — Encounter (HOSPITAL_COMMUNITY): Payer: Self-pay

## 2012-09-30 ENCOUNTER — Inpatient Hospital Stay (HOSPITAL_COMMUNITY)
Admission: AD | Admit: 2012-09-30 | Discharge: 2012-10-04 | DRG: 775 | Disposition: A | Discharge status: Home / Self Care | Payer: Medicaid Other | Source: Ambulatory Visit | Attending: Obstetrics and Gynecology | Admitting: Obstetrics and Gynecology

## 2012-09-30 DIAGNOSIS — O4290 Premature rupture of membranes, unspecified as to length of time between rupture and onset of labor, unspecified weeks of gestation: Secondary | ICD-10-CM | POA: Diagnosis present

## 2012-09-30 DIAGNOSIS — E669 Obesity, unspecified: Secondary | ICD-10-CM | POA: Diagnosis present

## 2012-09-30 DIAGNOSIS — O99814 Abnormal glucose complicating childbirth: Secondary | ICD-10-CM | POA: Diagnosis present

## 2012-09-30 HISTORY — DX: Gestational diabetes mellitus in pregnancy, unspecified control: O24.419

## 2012-09-30 LAB — TYPE AND SCREEN: Antibody Screen: NEGATIVE

## 2012-09-30 LAB — CBC
Hemoglobin: 11.5 g/dL — ABNORMAL LOW (ref 12.0–15.0)
MCHC: 33.2 g/dL (ref 30.0–36.0)
RDW: 15.4 % (ref 11.5–15.5)
WBC: 11.9 10*3/uL — ABNORMAL HIGH (ref 4.0–10.5)

## 2012-09-30 LAB — GLUCOSE, CAPILLARY
Glucose-Capillary: 118 mg/dL — ABNORMAL HIGH (ref 70–99)
Glucose-Capillary: 110 mg/dL — ABNORMAL HIGH (ref 70–99)
Glucose-Capillary: 84 mg/dL (ref 70–99)
Glucose-Capillary: 141 mg/dL — ABNORMAL HIGH (ref 70–99)

## 2012-09-30 LAB — RPR: RPR Ser Ql: NONREACTIVE

## 2012-09-30 LAB — AMNISURE RUPTURE OF MEMBRANE (ROM) NOT AT ARMC: Amnisure ROM: POSITIVE

## 2012-09-30 MED ORDER — OXYTOCIN 40 UNITS IN LACTATED RINGERS INFUSION - SIMPLE MED
62.5000 mL/h | INTRAVENOUS | Status: DC
  Administered 2012-10-02: 62.5 mL/h via INTRAVENOUS
  Filled 2012-09-30: qty 1000

## 2012-09-30 MED ORDER — CITRIC ACID-SODIUM CITRATE 334-500 MG/5ML PO SOLN: 30.0000 mL | ORAL | Status: DC | PRN

## 2012-09-30 MED ORDER — PHENYLEPHRINE 40 MCG/ML (10ML) SYRINGE FOR IV PUSH (FOR BLOOD PRESSURE SUPPORT)
80.0000 ug | PREFILLED_SYRINGE | INTRAVENOUS | Status: DC | PRN
  Filled 2012-09-30: qty 2

## 2012-09-30 MED ORDER — ONDANSETRON HCL 4 MG/2ML IJ SOLN: 4.0000 mg | Freq: Four times a day (QID) | INTRAMUSCULAR | Status: DC | PRN

## 2012-09-30 MED ORDER — EPHEDRINE 5 MG/ML INJ
10.0000 mg | INTRAVENOUS | Status: DC | PRN
Start: 2012-09-30 — End: 2012-10-02
  Filled 2012-09-30: qty 2
  Filled 2012-09-30: qty 4

## 2012-09-30 MED ORDER — OXYCODONE-ACETAMINOPHEN 5-325 MG PO TABS
1.0000 | ORAL_TABLET | ORAL | Status: DC | PRN
  Administered 2012-10-02: 1 via ORAL
  Filled 2012-09-30: qty 1

## 2012-09-30 MED ORDER — ACETAMINOPHEN 325 MG PO TABS: 650.0000 mg | ORAL_TABLET | ORAL | Status: DC | PRN

## 2012-09-30 MED ORDER — OXYTOCIN BOLUS FROM INFUSION: 500.0000 mL | INTRAVENOUS | Status: DC

## 2012-09-30 MED ORDER — PHENYLEPHRINE 40 MCG/ML (10ML) SYRINGE FOR IV PUSH (FOR BLOOD PRESSURE SUPPORT)
80.0000 ug | PREFILLED_SYRINGE | INTRAVENOUS | Status: DC | PRN
Start: 2012-09-30 — End: 2012-10-02
  Filled 2012-09-30: qty 5
  Filled 2012-09-30: qty 2

## 2012-09-30 MED ORDER — OXYTOCIN 40 UNITS IN LACTATED RINGERS INFUSION - SIMPLE MED
1.0000 m[IU]/min | INTRAVENOUS | Status: DC
  Administered 2012-09-30: 19 m[IU]/min via INTRAVENOUS
  Administered 2012-09-30: 1 m[IU]/min via INTRAVENOUS
  Filled 2012-09-30: qty 1000

## 2012-09-30 MED ORDER — TERBUTALINE SULFATE 1 MG/ML IJ SOLN: 0.2500 mg | Freq: Once | INTRAMUSCULAR | Status: AC | PRN

## 2012-09-30 MED ORDER — LACTATED RINGERS IV SOLN
INTRAVENOUS | Status: DC
  Administered 2012-09-30 – 2012-10-01 (×4): via INTRAVENOUS

## 2012-09-30 MED ORDER — LACTATED RINGERS IV SOLN: 500.0000 mL | Freq: Once | INTRAVENOUS | Status: DC

## 2012-09-30 MED ORDER — LACTATED RINGERS IV SOLN: 500.0000 mL | INTRAVENOUS | Status: DC | PRN

## 2012-09-30 MED ORDER — IBUPROFEN 600 MG PO TABS
600.0000 mg | ORAL_TABLET | Freq: Four times a day (QID) | ORAL | Status: DC | PRN
  Administered 2012-10-02: 600 mg via ORAL
  Filled 2012-09-30: qty 1

## 2012-09-30 MED ORDER — EPHEDRINE 5 MG/ML INJ
10.0000 mg | INTRAVENOUS | Status: DC | PRN
  Filled 2012-09-30: qty 2

## 2012-09-30 MED ORDER — FENTANYL 2.5 MCG/ML BUPIVACAINE 1/10 % EPIDURAL INFUSION (WH - ANES)
14.0000 mL/h | INTRAMUSCULAR | Status: DC | PRN
  Administered 2012-10-01: 16 mL/h via EPIDURAL
  Administered 2012-10-02: 14 mL/h via EPIDURAL
  Filled 2012-09-30 (×2): qty 125

## 2012-09-30 MED ORDER — DIPHENHYDRAMINE HCL 50 MG/ML IJ SOLN: 12.5000 mg | INTRAMUSCULAR | Status: DC | PRN

## 2012-09-30 MED ORDER — MISOPROSTOL 25 MCG QUARTER TABLET
25.0000 ug | ORAL_TABLET | ORAL | Status: DC
  Administered 2012-09-30 – 2012-10-01 (×3): 25 ug via ORAL
  Filled 2012-09-30: qty 1
  Filled 2012-09-30 (×3): qty 0.25

## 2012-09-30 MED ORDER — LIDOCAINE HCL (PF) 1 % IJ SOLN
30.0000 mL | INTRAMUSCULAR | Status: AC | PRN
  Administered 2012-10-02: 30 mL via SUBCUTANEOUS
  Filled 2012-09-30 (×2): qty 30

## 2012-09-30 MED ORDER — ZOLPIDEM TARTRATE 5 MG PO TABS
5.0000 mg | ORAL_TABLET | Freq: Every evening | ORAL | Status: DC | PRN
  Administered 2012-09-30: 5 mg via ORAL
  Filled 2012-09-30: qty 1

## 2012-09-30 NOTE — MAU Note (Signed)
Pt G3 P0 at 36.5wks reports a gush of clear fluid at 0100.  No pain or bleeding.  Not problems with pregnancy.

## 2012-09-30 NOTE — Progress Notes (Signed)
This RN was caring for another pt from 1100-1230 today; Lollie Marrow, RN charge nurse assumed care of this pt during that time. This RN has now reassumed care of pt.

## 2012-09-30 NOTE — Progress Notes (Signed)
  Subjective: Pt sitting up in bed eating dinner.  No reported UCs now, though reported cramping while on pitocin.  Objective: BP 135/71  Pulse 92  Temp(Src) 98.7 F (37.1 C) (Oral)  Resp 16  Ht 5\' 11"  (1.803 m)  Wt 355 lb 3.2 oz (161.118 kg)  BMI 49.56 kg/m2  LMP 01/17/2012      FHT:  Cat I UC:   irregular, every 3-5 minutes; mild  SVE:   Dilation: 1.5 Effacement (%): 70 Exam by:: J Mar Zettler CNM  Assessment / Plan:  Labor: IOL for PROM;  Pitocin through out the day; Pitocin d/c'd, pt to eat and shower then start Cytotec PO Preeclampsia: no s/s Fetal Wellbeing:  Pain Control: n/a at this time; desires epidural when nec. I/D: GBS neg; PROM at 0100 (x 21 hours); afibrile Anticipated MOD: SVD   Kim Sullivan 09/30/2012, 9:49 PM

## 2012-09-30 NOTE — MAU Provider Note (Signed)
History   32 yo G3P1010 at [redacted]w[redacted]d presents to MAU reporting gush of clear fluids at 0100 this am.  Denies VB, UCs, recent fever, resp or GI c/o's, UTI or PIH s/s. GFM.  Chief Complaint  Patient presents with  . Rupture of Membranes    OB History   Grav Para Term Preterm Abortions TAB SAB Ect Mult Living   3 1 1  0 1 0 1 0 0 0      Past Medical History  Diagnosis Date  . No pertinent past medical history   . Irregular heart beat   . Infection     UTI  . PID (pelvic inflammatory disease)     Past Surgical History  Procedure Laterality Date  . Tonsillectomy      Family History  Problem Relation Age of Onset  . Heart disease Father   . Hypertension Father   . Hypertension Maternal Grandfather   . Heart disease Maternal Grandfather   . Other Neg Hx   . Lupus Mother   . Lupus Maternal Aunt   . Heart disease Maternal Grandmother   . COPD Paternal Grandmother     liver and lung  . Heart disease Paternal Grandfather     History  Substance Use Topics  . Smoking status: Former Smoker -- 0.50 packs/day    Quit date: 01/31/2012  . Smokeless tobacco: Never Used  . Alcohol Use: No     Comment: occasionally/'not since pregnancy    Allergies: No Known Allergies  Prescriptions prior to admission  Medication Sig Dispense Refill  . acetaminophen (TYLENOL) 500 MG tablet Take 500 mg by mouth every 6 (six) hours as needed for pain (back pain).      Marland Kitchen glyBURIDE (DIABETA) 2.5 MG tablet Take 2.5 mg by mouth daily with breakfast.      . Prenatal Vit-Fe Fumarate-FA (PRENATAL MULTIVITAMIN) TABS Take 1 tablet by mouth daily with breakfast.       . diphenhydrAMINE (BENADRYL) 25 mg capsule Take 25 mg by mouth every 6 (six) hours as needed for itching.        ROS: see HPI above, all other systems are negative   Physical Exam   Blood pressure 124/69, pulse 110, temperature 98 F (36.7 C), temperature source Oral, resp. rate 20, height 5\' 11"  (1.803 m), weight 355 lb 3.2 oz (161.118  kg), last menstrual period 01/17/2012.  Chest: Clear Heart: RRR Abdomen: gravid, NT Extremities: WNL  Dilation: 1.5 Effacement (%): 70 Cervical Position: Middle Presentation: Vertex Exam by:: J Yamileth Hayse CNM  FHT: Reactive NST UCs: none  Fern neg  ED Course  IUP at [redacted]w[redacted]d ? SROM  Amnisure - Pos  Admit to BS - see H&P  Haroldine Laws CNM, MSN 09/30/2012 2:52 AM

## 2012-09-30 NOTE — H&P (Signed)
Kim Sullivan is a 32 y.o. female, G3P1010 at [redacted]w[redacted]d, presenting for PROM.  Pt reported a gush of clear fluids at 0100 this am.  Denies VB, UCs, recent fever, resp or GI c/o's, UTI or PIH s/s. GFM.  Desires epidural  Patient Active Problem List   Diagnosis Date Noted  . Miscarriage 08/12/2011  . Threatened abortion 08/08/2011  . UTI (lower urinary tract infection) 08/08/2011    History of present pregnancy: Patient entered care at 17 weeks.   EDC of 10/23/12 was established by LMP.   Anatomy scan:  19 weeks, with normal findings and an posterior placenta.   Additional [place] evaluations:  Weekly BPP; last BPP at [redacted]w[redacted]d - 8/10.    Significant prenatal events:  Abnormal early 1 hour GTT; 2 x 3 hour GTT with 1 abnormal elevation.  Discussed at High Risk meeting and placed on Glyburide for fasting hyperglycemia - pt took one dose last Friday didn't like the way it made her feel so she stopped taking and did not inform the office.    Last evaluation:  09/25/12 at [redacted]w[redacted]d    0cm / 50% / -2  OB History   Grav Para Term Preterm Abortions TAB SAB Ect Mult Living   3 1 1  0 1 0 1 0 0 0     Past Medical History  Diagnosis Date  . No pertinent past medical history   . Irregular heart beat   . Infection     UTI  . PID (pelvic inflammatory disease)    Past Surgical History  Procedure Laterality Date  . Tonsillectomy     Family History: family history includes COPD in her paternal grandmother; Heart disease in her father, maternal grandfather, maternal grandmother, and paternal grandfather; Hypertension in her father and maternal grandfather; Lupus in her maternal aunt and mother. There is no history of Other. Social History:  reports that she quit smoking about 7 months ago. She has never used smokeless tobacco. She reports that she does not drink alcohol or use illicit drugs.   Prenatal Transfer Tool  Maternal Diabetes: Yes:  Diabetes Type:  Insulin/Medication controlled - Abnormal early 1 hour  GTT; 2 x 3 hour GTT with 1 abnormal elevation.  Discussed at High Risk meeting and placed on Glyburide for fasting hyperglycemia - pt took one dose last Friday didn't like the way it made her feel so she stopped taking and did not inform the office.   Genetic Screening: Normal Maternal Ultrasounds/Referrals: Normal Fetal Ultrasounds or other Referrals:  None Maternal Substance Abuse:  No Significant Maternal Medications:  Meds include: Other: Glyburide was prescribed, only one dose was taken then pt stopped Significant Maternal Lab Results: Lab values include: Group B Strep negative    ROS: see HPI above, all other systems are negative  No Known Allergies   Dilation: 1.5 Effacement (%): 70 Exam by:: J Aqib Lough CNM Blood pressure 124/69, pulse 110, temperature 98 F (36.7 C), temperature source Oral, resp. rate 20, height 5\' 11"  (1.803 m), weight 355 lb 3.2 oz (161.118 kg), last menstrual period 01/17/2012.  Chest clear Heart RRR without murmur Abd gravid, NT Ext: WNL  FHR: Reactive NST UCs:  None noted on toco or by pt  Prenatal labs: ABO, Rh:  O pos Antibody:  Neg Rubella:   Immune RPR:   Neg HBsAg:   Neg HIV:   Neg GBS:  Neg Sickle cell/Hgb electrophoresis:  Normal study Pap:  05/21/12 - WNL GC:  Neg Chlamydia:  Neg  Genetic screenings:  AFP - neg Glucola:  1hr @ 19 weeks - 136;  3hr @ 20 weeks - 108, 173, 117, 64; 3hr @ 29 weeks - 108, 174, 149, 81 Other:  none    Assessment/Plan: IUP at [redacted]w[redacted]d PROM GBS neg Rx'd Glyburide 2.5 mg QD PO - only took 1 dose then stopped Desires Epidural  Admit to BS per c/w Dr. Su Hilt as attending MD Routine [facility] admission orders R&B of induction of labor after PROM were reviewed with the patient -- patient verbalizes understanding of these risks and wishes to proceed  Pitocin induction CBGs Q 4 hours   Rowan Blase, MSN 09/30/2012, 3:35 AM

## 2012-09-30 NOTE — Progress Notes (Signed)
CBG 83 at 1850.  Result did not transfer to flowsheet once machine docked.

## 2012-10-01 ENCOUNTER — Encounter (HOSPITAL_COMMUNITY): Payer: Self-pay | Admitting: Anesthesiology

## 2012-10-01 ENCOUNTER — Inpatient Hospital Stay (HOSPITAL_COMMUNITY): Payer: Medicaid Other | Admitting: Anesthesiology

## 2012-10-01 LAB — GLUCOSE, CAPILLARY
Glucose-Capillary: 83 mg/dL (ref 70–99)
Glucose-Capillary: 99 mg/dL (ref 70–99)
Glucose-Capillary: 102 mg/dL — ABNORMAL HIGH (ref 70–99)
Glucose-Capillary: 85 mg/dL (ref 70–99)

## 2012-10-01 MED ORDER — SODIUM CHLORIDE 0.9 % IV SOLN
3.0000 g | Freq: Four times a day (QID) | INTRAVENOUS | Status: DC
  Administered 2012-10-01 – 2012-10-02 (×2): 3 g via INTRAVENOUS
  Filled 2012-10-01 (×3): qty 3

## 2012-10-01 MED ORDER — FENTANYL CITRATE 0.05 MG/ML IJ SOLN
100.0000 ug | INTRAMUSCULAR | Status: DC | PRN
  Administered 2012-10-01: 100 ug via INTRAVENOUS
  Filled 2012-10-01: qty 2

## 2012-10-01 MED ORDER — OXYTOCIN 40 UNITS IN LACTATED RINGERS INFUSION - SIMPLE MED
1.0000 m[IU]/min | INTRAVENOUS | Status: DC
  Administered 2012-10-01: 2 m[IU]/min via INTRAVENOUS
  Filled 2012-10-01: qty 1000

## 2012-10-01 MED ORDER — LIDOCAINE HCL (PF) 1 % IJ SOLN
INTRAMUSCULAR | Status: DC | PRN
  Administered 2012-10-01 (×4): 4 mL

## 2012-10-01 NOTE — Progress Notes (Signed)
Kim Sullivan is a 32 y.o. G2P0010 at [redacted]w[redacted]d by ultrasound admitted for Preterm labor  Subjective:   Objective: BP 118/63  Pulse 84  Temp(Src) 98.3 F (36.8 C) (Oral)  Resp 18  Ht 5\' 11"  (1.803 m)  Wt 355 lb 3.2 oz (161.118 kg)  BMI 49.56 kg/m2  LMP 01/17/2012      FHT:  FHR: 130 bpm, variability: moderate,  accelerations:  Present,  decelerations:  Absent UC:   regular, every 2 minutes SVE:   Dilation: 4 Effacement (%): 70 Station: -1 Exam by:: Dr. Normand Sloop  Labs: Lab Results  Component Value Date   WBC 11.9* 09/30/2012   HGB 11.5* 09/30/2012   HCT 34.6* 09/30/2012   MCV 85.9 09/30/2012   PLT 277 09/30/2012    Assessment / Plan: Induction of labor due to PROM,  progressing well on pitocin  Labor: continue pitocin Preeclampsia:  no signs or symptoms of toxicity Fetal Wellbeing:  Category I Pain Control:  Labor support without medications I/D:  n/a Anticipated MOD:  NSVD and    Kim Sullivan A 10/01/2012, 7:25 PM

## 2012-10-01 NOTE — Progress Notes (Signed)
  Subjective: Pt comfortable in bed watching TV and visiting with family.  Discussed with pt the potential need for an IUPC.  R&B of IUPC were reviewed with the patient -- patient verbalizes understanding of these risks.   Objective: BP 98/40  Pulse 90  Temp(Src) 98.3 F (36.8 C) (Oral)  Resp 20  Ht 5\' 11"  (1.803 m)  Wt 355 lb 3.2 oz (161.118 kg)  BMI 49.56 kg/m2  LMP 01/17/2012      FHT:  Cat I UC:   regular, every 2-3 minutes; reported mild  SVE:   Deferred  Assessment / Plan:  Labor: IOL for PROM; continue pitocin Preeclampsia: no s/s Fetal Wellbeing: Cat I Pain Control: n/a at this time; pt reports mild cramping I/D: GBS neg; PROM x 43.5 hours; afibrile Anticipated MOD: SVD; pt states that she is interested in c/s tomorrow if labor is not progressing.   Haroldine Laws 10/01/2012, 8:29 PM

## 2012-10-01 NOTE — Progress Notes (Signed)
Dorien Bessent is a 32 y.o. G2P0010 at [redacted]w[redacted]d by LMP admitted for PROM  Subjective: Patient is without complaints  Objective: BP 102/55  Pulse 90  Temp(Src) 98.5 F (36.9 C) (Oral)  Resp 18  Ht 5\' 11"  (1.803 m)  Wt 355 lb 3.2 oz (161.118 kg)  BMI 49.56 kg/m2  LMP 01/17/2012      FHT:  FHR: 130 bpm, variability: moderate,  accelerations:  Present,  decelerations:  Absent UC:   regular, every 4-5 minutes SVE:   Dilation: 4 Effacement (%): 70 Station: -2 Exam by:: Marijean Heath, RN  Labs: Lab Results  Component Value Date   WBC 11.9* 09/30/2012   HGB 11.5* 09/30/2012   HCT 34.6* 09/30/2012   MCV 85.9 09/30/2012   PLT 277 09/30/2012    Assessment / Plan: Augmentation of labor, progressing well  Labor: PPROm will start pitocin Preeclampsia:  no signs or symptoms of toxicity Fetal Wellbeing:  Category I Pain Control:  Labor support without medications I/D:  n/a Anticipated MOD:  NSVD  Serafino Burciaga A 10/01/2012, 1:04 PM

## 2012-10-01 NOTE — Anesthesia Preprocedure Evaluation (Signed)
Anesthesia Evaluation  Patient identified by MRN, date of birth, ID band Patient awake    Reviewed: Allergy & Precautions, H&P , NPO status , Patient's Chart, lab work & pertinent test results, reviewed documented beta blocker date and time   History of Anesthesia Complications Negative for: history of anesthetic complications  Airway Mallampati: III TM Distance: >3 FB Neck ROM: full    Dental  (+) Teeth Intact   Pulmonary neg pulmonary ROS, former smoker (quit in january),  breath sounds clear to auscultation        Cardiovascular negative cardio ROS  Rhythm:regular Rate:Normal     Neuro/Psych negative neurological ROS  negative psych ROS   GI/Hepatic negative GI ROS, Neg liver ROS,   Endo/Other  diabetes, Gestational, Oral Hypoglycemic AgentsMorbid obesity  Renal/GU negative Renal ROS     Musculoskeletal   Abdominal   Peds  Hematology negative hematology ROS (+)   Anesthesia Other Findings   Reproductive/Obstetrics (+) Pregnancy                           Anesthesia Physical Anesthesia Plan  ASA: III  Anesthesia Plan: Epidural   Post-op Pain Management:    Induction:   Airway Management Planned:   Additional Equipment:   Intra-op Plan:   Post-operative Plan:   Informed Consent: I have reviewed the patients History and Physical, chart, labs and discussed the procedure including the risks, benefits and alternatives for the proposed anesthesia with the patient or authorized representative who has indicated his/her understanding and acceptance.     Plan Discussed with:   Anesthesia Plan Comments:         Anesthesia Quick Evaluation

## 2012-10-01 NOTE — Anesthesia Procedure Notes (Signed)
Epidural Patient location during procedure: OB Start time: 10/01/2012 11:14 PM  Staffing Anesthesiologist: Myron Stankovich Performed by: anesthesiologist   Preanesthetic Checklist Completed: patient identified, site marked, surgical consent, pre-op evaluation, timeout performed, IV checked, risks and benefits discussed and monitors and equipment checked  Epidural Patient position: sitting Prep: site prepped and draped and DuraPrep Patient monitoring: continuous pulse ox and blood pressure Approach: midline Injection technique: LOR air  Needle:  Needle type: Tuohy  Needle gauge: 17 G Needle length: 9 cm and 9 Needle insertion depth: 7.5 cm Catheter type: closed end flexible Catheter size: 19 Gauge Catheter at skin depth: 12.5 cm Test dose: negative  Assessment Events: blood not aspirated, injection not painful, no injection resistance, negative IV test and no paresthesia  Additional Notes Discussed risk of headache, infection, bleeding, nerve injury and failed or incomplete block.  Patient voices understanding and wishes to proceed.  Epidural placed easily on first attempt. No paresthesia. Patient tolerated procedure well with no apparent complications.  Jasmine December, MDReason for block:procedure for pain

## 2012-10-02 ENCOUNTER — Encounter (HOSPITAL_COMMUNITY): Payer: Self-pay | Admitting: *Deleted

## 2012-10-02 MED ORDER — DIPHENHYDRAMINE HCL 25 MG PO CAPS: 25.0000 mg | ORAL_CAPSULE | Freq: Four times a day (QID) | ORAL | Status: DC | PRN

## 2012-10-02 MED ORDER — WITCH HAZEL-GLYCERIN EX PADS: 1.0000 "application " | MEDICATED_PAD | CUTANEOUS | Status: DC | PRN

## 2012-10-02 MED ORDER — BENZOCAINE-MENTHOL 20-0.5 % EX AERO: 1.0000 "application " | INHALATION_SPRAY | CUTANEOUS | Status: DC | PRN

## 2012-10-02 MED ORDER — PRENATAL MULTIVITAMIN CH
1.0000 | ORAL_TABLET | Freq: Every day | ORAL | Status: DC
  Administered 2012-10-02 – 2012-10-04 (×3): 1 via ORAL
  Filled 2012-10-02 (×3): qty 1

## 2012-10-02 MED ORDER — TETANUS-DIPHTH-ACELL PERTUSSIS 5-2.5-18.5 LF-MCG/0.5 IM SUSP
0.5000 mL | Freq: Once | INTRAMUSCULAR | Status: AC
  Administered 2012-10-02: 0.5 mL via INTRAMUSCULAR

## 2012-10-02 MED ORDER — OXYCODONE-ACETAMINOPHEN 5-325 MG PO TABS
1.0000 | ORAL_TABLET | ORAL | Status: DC | PRN
  Administered 2012-10-02 – 2012-10-03 (×3): 1 via ORAL
  Filled 2012-10-02 (×3): qty 1

## 2012-10-02 MED ORDER — ONDANSETRON HCL 4 MG PO TABS: 4.0000 mg | ORAL_TABLET | ORAL | Status: DC | PRN

## 2012-10-02 MED ORDER — SIMETHICONE 80 MG PO CHEW: 80.0000 mg | CHEWABLE_TABLET | ORAL | Status: DC | PRN

## 2012-10-02 MED ORDER — SENNOSIDES-DOCUSATE SODIUM 8.6-50 MG PO TABS
2.0000 | ORAL_TABLET | Freq: Every day | ORAL | Status: DC
  Administered 2012-10-02 – 2012-10-03 (×2): 2 via ORAL

## 2012-10-02 MED ORDER — LANOLIN HYDROUS EX OINT: TOPICAL_OINTMENT | CUTANEOUS | Status: DC | PRN

## 2012-10-02 MED ORDER — DIBUCAINE 1 % RE OINT: 1.0000 "application " | TOPICAL_OINTMENT | RECTAL | Status: DC | PRN

## 2012-10-02 MED ORDER — IBUPROFEN 600 MG PO TABS
600.0000 mg | ORAL_TABLET | Freq: Four times a day (QID) | ORAL | Status: DC
  Administered 2012-10-02 – 2012-10-04 (×8): 600 mg via ORAL
  Filled 2012-10-02 (×9): qty 1

## 2012-10-02 MED ORDER — ONDANSETRON HCL 4 MG/2ML IJ SOLN: 4.0000 mg | INTRAMUSCULAR | Status: DC | PRN

## 2012-10-02 MED ORDER — ZOLPIDEM TARTRATE 5 MG PO TABS: 5.0000 mg | ORAL_TABLET | Freq: Every evening | ORAL | Status: DC | PRN

## 2012-10-02 NOTE — Consult Note (Signed)
Neonatology Note:  Attendance at Code Apgar:   Our team responded to a Code Apgar call to room # 162 following NSVD at 37 0/7 weeks, due to infant with apnea. The requesting practioner was Haroldine Laws, CNM. The mother is a G3P1A1 O pos, GBS neg with borderline GDM (mother not taking prescribed Glyburide). Premature ROM occurred 53 hours PTD at 36 5/[redacted] weeks GA and the fluid was clear. The mother remained afebrile throughout labor and she received Unasyn > 4 hours before delivery.  At delivery, the baby was apneic. The OB nursing staff in attendance gave vigorous stimulation and a Code Apgar was called. Our team arrived at 1.5 minutes of life, at which time the baby was getting PPV. We bulb suctioned and got a large amount of thick mucous from the pharynx, then gave stimulation, after which the baby breathed and began to cry. A pulse oximeter was placed and the O2 saturations were normal at 4 minutes, increasing to the mid-90s by 6-7 min of life. The baby had no resp distress and his lungs were clear. Perfusion and tone were good. He tended to hold the left arm in an outstretched position, but he had at least some muscle tone even at the shoulder, so no definite focal deficits noted. Ap 3/9.  I spoke with the parents in the DR, then transferred the baby to the Pediatrician's care.   Doretha Sou, MD

## 2012-10-02 NOTE — Progress Notes (Signed)
  Subjective: Pt is comfortable with epidural. Up with family visiting and watching TV.  Objective: BP 109/53  Pulse 80  Temp(Src) 98.6 F (37 C) (Oral)  Resp 18  Ht 5\' 11"  (1.803 m)  Wt 355 lb 3.2 oz (161.118 kg)  BMI 49.56 kg/m2  SpO2 94%  LMP 01/17/2012      FHT:  Cat I UC:   regular, every 2-5 minutes  SVE:   Dilation: 9 Effacement (%): 100 Station: +1 Exam by:: Kim Sullivan cnm  Assessment / Plan:  Labor: IOL for PROM; continue pitocin; has reached 30 milliU - will maintain there  Preeclampsia: no s/s  Fetal Wellbeing: Cat I  Pain Control: Epidural I/D: GBS neg; PROM x 49 hours; afibrile; Unasyn prophylaxis initiated at 0100 per Dr. Dion Sullivan Anticipated MOD: SVD  Kim Sullivan 10/02/2012, 2:00 AM

## 2012-10-02 NOTE — Anesthesia Postprocedure Evaluation (Signed)
Anesthesia Post Note  Patient: Kim Sullivan  Procedure(s) Performed: * No procedures listed *  Anesthesia type: Epidural  Patient location: Mother/Baby  Post pain: Pain level controlled  Post assessment: Post-op Vital signs reviewed  Last Vitals:  Filed Vitals:   10/02/12 1430  BP: 110/75  Pulse: 98  Temp: 36.6 C  Resp: 20    Post vital signs: Reviewed  Level of consciousness:alert  Complications: No apparent anesthesia complications

## 2012-10-03 LAB — CBC
HCT: 30.6 % — ABNORMAL LOW (ref 36.0–46.0)
Platelets: 249 10*3/uL (ref 150–400)
RBC: 3.53 MIL/uL — ABNORMAL LOW (ref 3.87–5.11)
RDW: 15.7 % — ABNORMAL HIGH (ref 11.5–15.5)
WBC: 14.4 10*3/uL — ABNORMAL HIGH (ref 4.0–10.5)

## 2012-10-03 NOTE — Progress Notes (Signed)
UR chart review completed.  

## 2012-10-03 NOTE — Progress Notes (Signed)
Post Partum Day 1 Subjective: no complaints, up ad lib, voiding and tolerating PO  Objective: Blood pressure 119/76, pulse 80, temperature 98.3 F (36.8 C), temperature source Oral, resp. rate 18, height 5\' 11"  (1.803 m), weight 355 lb 3.2 oz (161.118 kg), last menstrual period 01/17/2012, SpO2 94.00%, unknown if currently breastfeeding.  Physical Exam:  General: alert, cooperative, no distress and morbidly obese Lochia: appropriate Uterine Fundus: firm DVT Evaluation: No evidence of DVT seen on physical exam.   Recent Labs  10/03/12 0535  HGB 9.9*  HCT 30.6*    Assessment/Plan: Plan for discharge tomorrow and Breastfeeding Plans circ in office  LOS: 3 days   Avrum Kimball P 10/03/2012, 9:22 PM

## 2012-10-04 MED ORDER — IBUPROFEN 600 MG PO TABS: 600.0000 mg | ORAL_TABLET | Freq: Four times a day (QID) | ORAL | Status: DC | PRN

## 2012-10-04 MED ORDER — OXYCODONE-ACETAMINOPHEN 5-325 MG PO TABS: 1.0000 | ORAL_TABLET | ORAL | Status: DC | PRN

## 2012-10-04 NOTE — Discharge Summary (Signed)
  Vaginal Delivery Discharge Summary  Kim Sullivan  DOB:    [birth year] MRN:    [id number] CSN:    [id number]  Date of admission:                  09/30/12  Date of discharge:                   10/04/12  Procedures this admission:  Date of Delivery: 10/02/12  Newborn Data:  Live born female  Birth Weight: 6 lb 15.1 oz (3150 g) APGAR: 3, 9  Home with mother.  Circumcision Plan: In office  History of Present Illness:  Ms. Kim Sullivan is a 32 y.o. female, G2P1011, who presents at [redacted]w[redacted]d weeks gestation. The patient has been followed at the [facility] division of [facility]. She was admitted rupture of membranes. Her pregnancy has been complicated by:  Patient Active Problem List   Diagnosis Date Noted  . Vaginal delivery 10/04/2012  . Miscarriage 08/12/2011  . Threatened abortion 08/08/2011  . UTI (lower urinary tract infection) 08/08/2011     Hospital course:  The patient was admitted for ROM.   Her labor was not complicated. She proceeded to have a vaginal delivery of a healthy infant. Her delivery was complicated by infant with initial apnea, and Apgars of 3/9, with NICU team at delivery.  Delivery was attended by resident, Dr. Reola Calkins, due to [facility] CNM attending another birth at the same time. Her postpartum course was not complicated, but infant did have jaundice and was on phototherapy on mom's day of discharge--infant's d/c status is still pending at the time of mother's d/c.  She was discharged to home on postpartum day 2 doing well.  Feeding:  breast  Contraception:  Will decide by 6 weeks.  Discharge hemoglobin:  Hemoglobin  Date Value Range Status  10/03/2012 9.9* 12.0 - 15.0 g/dL Final     HCT  Date Value Range Status  10/03/2012 30.6* 36.0 - 46.0 % Final    Discharge Physical Exam:   General: alert Lochia: appropriate Uterine Fundus: firm Incision: healing well DVT Evaluation: No evidence of DVT seen  on physical exam. Negative Homan's sign.  Intrapartum Procedures: spontaneous vaginal delivery Postpartum Procedures: none Complications-Operative and Postpartum: 2nd degree perineal laceration  Discharge Diagnoses: Term Pregnancy-delivered  Discharge Information:  Activity:           Per [facility] handout Diet:                routine Medications: Ibuprofen and Percocet Condition:      stable Instructions:  refer to practice specific booklet Discharge to: home     Kim Sullivan 10/04/2012

## 2012-10-04 NOTE — Progress Notes (Signed)
Patient discharge papers and instructions reviewed, denies questions at this time.

## 2012-10-04 NOTE — Progress Notes (Signed)
Notified Rosary Lively CMW, patients prescriptions need signed.

## 2013-03-31 ENCOUNTER — Encounter (HOSPITAL_COMMUNITY): Payer: Self-pay | Admitting: Emergency Medicine

## 2013-03-31 ENCOUNTER — Emergency Department (HOSPITAL_COMMUNITY)
Admission: EM | Admit: 2013-03-31 | Discharge: 2013-03-31 | Disposition: A | Discharge status: Home / Self Care | Payer: Medicaid Other | Attending: Emergency Medicine | Admitting: Emergency Medicine

## 2013-03-31 DIAGNOSIS — L03213 Periorbital cellulitis: Secondary | ICD-10-CM

## 2013-03-31 DIAGNOSIS — Z87440 Personal history of urinary (tract) infections: Secondary | ICD-10-CM | POA: Insufficient documentation

## 2013-03-31 DIAGNOSIS — Z79899 Other long term (current) drug therapy: Secondary | ICD-10-CM | POA: Insufficient documentation

## 2013-03-31 DIAGNOSIS — R221 Localized swelling, mass and lump, neck: Secondary | ICD-10-CM

## 2013-03-31 DIAGNOSIS — H05019 Cellulitis of unspecified orbit: Secondary | ICD-10-CM | POA: Insufficient documentation

## 2013-03-31 DIAGNOSIS — Z8632 Personal history of gestational diabetes: Secondary | ICD-10-CM | POA: Insufficient documentation

## 2013-03-31 DIAGNOSIS — Z8679 Personal history of other diseases of the circulatory system: Secondary | ICD-10-CM | POA: Insufficient documentation

## 2013-03-31 DIAGNOSIS — Z8742 Personal history of other diseases of the female genital tract: Secondary | ICD-10-CM | POA: Insufficient documentation

## 2013-03-31 DIAGNOSIS — Z87891 Personal history of nicotine dependence: Secondary | ICD-10-CM | POA: Insufficient documentation

## 2013-03-31 DIAGNOSIS — R220 Localized swelling, mass and lump, head: Secondary | ICD-10-CM | POA: Insufficient documentation

## 2013-03-31 MED ORDER — CLINDAMYCIN HCL 150 MG PO CAPS
300.0000 mg | ORAL_CAPSULE | Freq: Once | ORAL | Status: AC
  Administered 2013-03-31: 300 mg via ORAL
  Filled 2013-03-31: qty 2

## 2013-03-31 MED ORDER — CLINDAMYCIN HCL 150 MG PO CAPS: 300.0000 mg | ORAL_CAPSULE | Freq: Three times a day (TID) | ORAL | Status: DC

## 2013-03-31 MED ORDER — TETRACAINE HCL 0.5 % OP SOLN
1.0000 [drp] | Freq: Once | OPHTHALMIC | Status: AC
  Administered 2013-03-31: 1 [drp] via OPHTHALMIC
  Filled 2013-03-31: qty 2

## 2013-03-31 MED ORDER — FLUORESCEIN SODIUM 1 MG OP STRP
1.0000 | ORAL_STRIP | Freq: Once | OPHTHALMIC | Status: AC
  Administered 2013-03-31: 1 via OPHTHALMIC
  Filled 2013-03-31: qty 1

## 2013-03-31 NOTE — ED Provider Notes (Signed)
CSN: [id number]     Arrival date & time 03/31/13  1421 History  This chart was scribed for non-physician practitioner, Roxy Horseman, PA-C working with Doug Sou, MD by Greggory Stallion, ED scribe. This patient was seen in room TR04C/TR04C and the patient's care was started at 2:55 PM.     Chief Complaint  Patient presents with  . Eye Problem   The history is provided by the patient. No language interpreter was used.   HPI Comments: Kim Sullivan is a 33 y.o. female who presents to the Emergency Department complaining of gradual onset, worsening right upper lid swelling and mild pain that started 2 days ago. Palpation worsens the pain. She states there is some itching around her eye. She has not tried anything to alleviate her symptoms. Pt is not currently breastfeeding.   Past Medical History  Diagnosis Date  . No pertinent past medical history   . Irregular heart beat   . Infection     UTI  . PID (pelvic inflammatory disease)   . Gestational diabetes    Past Surgical History  Procedure Laterality Date  . Tonsillectomy     Family History  Problem Relation Age of Onset  . Heart disease Father   . Hypertension Father   . Hypertension Maternal Grandfather   . Heart disease Maternal Grandfather   . Other Neg Hx   . Lupus Mother   . Lupus Maternal Aunt   . Heart disease Maternal Grandmother   . COPD Paternal Grandmother     liver and lung  . Heart disease Paternal Grandfather    History  Substance Use Topics  . Smoking status: Former Smoker -- 0.50 packs/day    Quit date: 01/31/2012  . Smokeless tobacco: Never Used  . Alcohol Use: No     Comment: occasionally/'not since pregnancy   OB History   Grav Para Term Preterm Abortions TAB SAB Ect Mult Living   2 1 1  0 1 0 1 0 0 1     Review of Systems  Constitutional: Negative for fever.  HENT: Positive for facial swelling.   Eyes: Positive for pain and itching.  Respiratory: Negative for shortness of breath.    Cardiovascular: Negative for chest pain.  Gastrointestinal: Negative for abdominal distention.  Musculoskeletal: Negative for gait problem.  Skin: Negative for wound.  Neurological: Negative for speech difficulty.  Psychiatric/Behavioral: Negative for confusion.   Allergies  Review of patient's allergies indicates no known allergies.  Home Medications   Current Outpatient Rx  Name  Route  Sig  Dispense  Refill  . acetaminophen (TYLENOL) 500 MG tablet   Oral   Take 500 mg by mouth every 6 (six) hours as needed for pain (back pain).         Marland Kitchen diphenhydrAMINE (BENADRYL) 25 mg capsule   Oral   Take 25 mg by mouth every 6 (six) hours as needed for itching.         Marland Kitchen ibuprofen (ADVIL,MOTRIN) 600 MG tablet   Oral   Take 1 tablet (600 mg total) by mouth every 6 (six) hours as needed for pain.   36 tablet   2   . oxyCODONE-acetaminophen (PERCOCET/ROXICET) 5-325 MG per tablet   Oral   Take 1-2 tablets by mouth every 4 (four) hours as needed.   30 tablet   0   . Prenatal Vit-Fe Fumarate-FA (PRENATAL MULTIVITAMIN) TABS   Oral   Take 1 tablet by mouth daily with breakfast.  BP 120/78  Pulse 88  Temp(Src) 99 F (37.2 C) (Oral)  Resp 18  Wt 324 lb (146.965 kg)  LMP 03/06/2013  Breastfeeding? No  Physical Exam  Nursing note and vitals reviewed. Constitutional: She is oriented to person, place, and time. She appears well-developed and well-nourished. No distress.  HENT:  Head: Normocephalic and atraumatic.  Eyes: Conjunctivae and EOM are normal.  Visual Acuity - R Near: 20/25 ; R Distance: 20/30 ; L Near: 20/20 ; L Distance: 20/20  Right eyelid is moderately swollen and mildly erythematous. No discharge or drainage. Normal ROM. No evidence of entrapment. No proptosis. No foreign bodies. No fluorescein uptake. No corneal abrasion.   Cardiovascular: Normal rate and regular rhythm.   Pulmonary/Chest: Effort normal and breath sounds normal. No stridor. No  respiratory distress.  Abdominal: She exhibits no distension.  Musculoskeletal: She exhibits no edema.  Neurological: She is alert and oriented to person, place, and time. No cranial nerve deficit.  Skin: Skin is warm and dry.  Psychiatric: She has a normal mood and affect.    ED Course  Procedures (including critical care time)  DIAGNOSTIC STUDIES: Filed Vitals:   03/31/13 1439  BP: 120/78  Pulse: 88  Temp: 99 F (37.2 C)  Resp: 18    COORDINATION OF CARE: 2:59 PM-Discussed treatment plan which includes checking for corneal abrasion and an antibiotic with pt at bedside and pt agreed to plan. Return precautions given to patient.   Labs Review Labs Reviewed - No data to display Imaging Review No results found.   EKG Interpretation None      MDM   Final diagnoses:  Preseptal cellulitis of right eye   Patient with swollen eyelid.  There is no evidence of deep infection, entrapment, or proptosis.  No pain with eye movement.  I will treat with clindamycin.  Specific return precautions have been given.  The patient with return immediately for worsening symptoms.  Patient is afebrile, VSS.  She is stable and ready for discharge.  I personally performed the services described in this documentation, which was scribed in my presence. The recorded information has been reviewed and is accurate.    Roxy Horsemanobert Klarissa Mcilvain, PA-C 03/31/13 1550

## 2013-03-31 NOTE — ED Notes (Signed)
Pt is here with right upper lid swelling and pain

## 2013-03-31 NOTE — Discharge Instructions (Signed)
Periorbital Cellulitis °Periorbital cellulitis is a common infection that can affect the eyelid and the soft tissues that surround the eyeball. The infection may also affect the structures that produce and drain tears. It does not affect the eyeball itself. Natural tissue barriers usually prevent the spread of this infection to the eyeball and other deeper areas of the eye socket.      °CAUSES °· Bacterial infection. °· Long-term (chronic) sinus infections. °· An object (foreign body) stuck behind the eye. °· An injury that goes through the eyelid tissues. °· An injury that causes an infection, such as an insect sting. °· Fracture of the bone around the eye. °· Infections which have spread from the eyelid or other structures around the eye. °· Bite wounds. °· Inflammation or infection of the lining membranes of the brain (meningitis). °· An infection in the blood (septicemia). °· Dental infection (abscess). °· Viral infection (this is rare). °SYMPTOMS °Symptoms usually come on suddenly. °· Pain in the eye. °· Red, hot, and swollen eyelids and possibly cheeks. The swelling is sometimes bad enough that the eyelids cannot open. Some infections make the eyelids look purple. °· Fever and feeling generally ill. °· Pain when touching the area around the eye. °DIAGNOSIS  °Periorbital cellulitis can be diagnosed from an eye exam. In severe cases, your caregiver might suggest: °· Blood tests. °· Imaging tests (such as a CT scan) to examine the sinuses and the area around and behind the eyeball. °TREATMENT °If your caregiver feels that you do not have any signs of serious infection, treatment may include: °· Antibiotics. °· Nasal decongestants to reduce swelling. °· Referral to a dentist if it is suspected that the infection was caused by a prior tooth infection. °· Examination every day to make sure the problem is improving. °HOME CARE INSTRUCTIONS °· Take your antibiotics as directed. Finish them even if you start to feel  better. °· Some pain is normal with this condition. Take pain medicine as directed by your caregiver. Only take pain medicines approved by your caregiver. °· It is important to drink fluids. Drink enough water and fluids to keep your urine clear or pale yellow. °· Do not smoke. °· Rest and get plenty of sleep. °· Mild or moderate fevers generally have no long-term effects and often do not require treatment. °· If your caregiver has given you a follow-up appointment, it is very important to keep that appointment. Your caregiver will need to make sure that the infection is getting better. It is important to check that a more serious infection is not developing. °SEEK IMMEDIATE MEDICAL CARE IF: °· Your eyelids become more painful, red, warm, or swollen. °· You develop double vision or your vision becomes blurred or worsens in any way. °· You have trouble moving your eyes. °· The eye looks like it is popping out (proptosis). °· You develop a severe headache, severe neck pain, or neck stiffness. °· You develop repeated vomiting. °· You have a fever or persistent symptoms for more than 72 hours. °· You have a fever and your symptoms suddenly get worse. °MAKE SURE YOU: °· Understand these instructions. °· Will watch your condition. °· Will get help right away if you are not doing well or get worse. °Document Released: 02/17/2010 Document Revised: 04/09/2011 Document Reviewed: 02/17/2010 °[employer]® Patient Information ©2014 [employer]. ° °

## 2013-03-31 NOTE — ED Provider Notes (Signed)
Medical screening examination/treatment/procedure(s) were performed by non-physician practitioner and as supervising physician I was immediately available for consultation/collaboration.   EKG Interpretation None       Jakeisha Stricker, MD 03/31/13 1619 

## 2013-08-20 ENCOUNTER — Inpatient Hospital Stay (HOSPITAL_COMMUNITY): Payer: Self-pay

## 2013-08-20 ENCOUNTER — Inpatient Hospital Stay (HOSPITAL_COMMUNITY)
Admission: AD | Admit: 2013-08-20 | Discharge: 2013-08-20 | Disposition: A | Discharge status: Home / Self Care | Payer: Self-pay | Source: Ambulatory Visit | Attending: Obstetrics and Gynecology | Admitting: Obstetrics and Gynecology

## 2013-08-20 ENCOUNTER — Encounter (HOSPITAL_COMMUNITY): Payer: Self-pay | Admitting: *Deleted

## 2013-08-20 ENCOUNTER — Inpatient Hospital Stay (HOSPITAL_COMMUNITY): Payer: Medicaid Other

## 2013-08-20 DIAGNOSIS — Z8249 Family history of ischemic heart disease and other diseases of the circulatory system: Secondary | ICD-10-CM | POA: Insufficient documentation

## 2013-08-20 DIAGNOSIS — I498 Other specified cardiac arrhythmias: Secondary | ICD-10-CM | POA: Insufficient documentation

## 2013-08-20 DIAGNOSIS — N949 Unspecified condition associated with female genital organs and menstrual cycle: Secondary | ICD-10-CM | POA: Insufficient documentation

## 2013-08-20 DIAGNOSIS — F17200 Nicotine dependence, unspecified, uncomplicated: Secondary | ICD-10-CM | POA: Insufficient documentation

## 2013-08-20 DIAGNOSIS — N926 Irregular menstruation, unspecified: Secondary | ICD-10-CM | POA: Insufficient documentation

## 2013-08-20 DIAGNOSIS — R1032 Left lower quadrant pain: Secondary | ICD-10-CM | POA: Insufficient documentation

## 2013-08-20 DIAGNOSIS — R109 Unspecified abdominal pain: Secondary | ICD-10-CM

## 2013-08-20 LAB — URINALYSIS, ROUTINE W REFLEX MICROSCOPIC
BILIRUBIN URINE: NEGATIVE
Glucose, UA: NEGATIVE mg/dL
KETONES UR: NEGATIVE mg/dL
NITRITE: NEGATIVE
PH: 6 (ref 5.0–8.0)
Protein, ur: NEGATIVE mg/dL
Specific Gravity, Urine: 1.01 (ref 1.005–1.030)
Urobilinogen, UA: 0.2 mg/dL (ref 0.0–1.0)

## 2013-08-20 LAB — COMPREHENSIVE METABOLIC PANEL
ALBUMIN: 3.4 g/dL — AB (ref 3.5–5.2)
ALT: 23 U/L (ref 0–35)
ANION GAP: 8 (ref 5–15)
AST: 24 U/L (ref 0–37)
Alkaline Phosphatase: 67 U/L (ref 39–117)
BILIRUBIN TOTAL: 0.4 mg/dL (ref 0.3–1.2)
BUN: 12 mg/dL (ref 6–23)
CALCIUM: 8.9 mg/dL (ref 8.4–10.5)
CHLORIDE: 102 meq/L (ref 96–112)
CO2: 26 mEq/L (ref 19–32)
Creatinine, Ser: 0.63 mg/dL (ref 0.50–1.10)
GFR calc Af Amer: >90 mL/min (ref >90)
GFR calc non Af Amer: >90 mL/min (ref >90)
Glucose, Bld: 97 mg/dL (ref 70–99)
Potassium: 4.6 mEq/L (ref 3.7–5.3)
Sodium: 136 mEq/L — ABNORMAL LOW (ref 137–147)
Total Protein: 7 g/dL (ref 6.0–8.3)

## 2013-08-20 LAB — CBC WITH DIFFERENTIAL/PLATELET
BASOS ABS: 0 10*3/uL (ref 0.0–0.1)
Basophils Relative: 0 % (ref 0–1)
EOS ABS: 0.3 10*3/uL (ref 0.0–0.7)
Eosinophils Relative: 1 % (ref 0–5)
HCT: 41.8 % (ref 36.0–46.0)
Hemoglobin: 13.9 g/dL (ref 12.0–15.0)
Lymphocytes Relative: 12 % (ref 12–46)
Lymphs Abs: 2.3 10*3/uL (ref 0.7–4.0)
MCH: 30.2 pg (ref 26.0–34.0)
MCHC: 33.3 g/dL (ref 30.0–36.0)
MCV: 90.9 fL (ref 78.0–100.0)
Monocytes Absolute: 1 10*3/uL (ref 0.1–1.0)
Monocytes Relative: 5 % (ref 3–12)
NEUTROS ABS: 15.9 10*3/uL — AB (ref 1.7–7.7)
Neutrophils Relative %: 82 % — ABNORMAL HIGH (ref 43–77)
Platelets: 214 10*3/uL (ref 150–400)
RBC: 4.6 MIL/uL (ref 3.87–5.11)
RDW: 13.8 % (ref 11.5–15.5)
WBC: 19.4 10*3/uL — ABNORMAL HIGH (ref 4.0–10.5)

## 2013-08-20 LAB — WET PREP, GENITAL
CLUE CELLS WET PREP: NONE SEEN
TRICH WET PREP: NONE SEEN
YEAST WET PREP: NONE SEEN

## 2013-08-20 LAB — URINE MICROSCOPIC-ADD ON

## 2013-08-20 LAB — POCT PREGNANCY, URINE: Preg Test, Ur: NEGATIVE

## 2013-08-20 MED ORDER — ACETAMINOPHEN-CODEINE #3 300-30 MG PO TABS: 2.0000 | ORAL_TABLET | Freq: Four times a day (QID) | ORAL | Status: DC | PRN

## 2013-08-20 MED ORDER — IOHEXOL 300 MG/ML  SOLN
100.0000 mL | Freq: Once | INTRAMUSCULAR | Status: AC | PRN
  Administered 2013-08-20: 100 mL via INTRAVENOUS

## 2013-08-20 MED ORDER — IOHEXOL 300 MG/ML  SOLN
50.0000 mL | INTRAMUSCULAR | Status: AC
  Administered 2013-08-20 (×2): 50 mL via ORAL

## 2013-08-20 MED ORDER — ACETAMINOPHEN 325 MG PO TABS
650.0000 mg | ORAL_TABLET | Freq: Once | ORAL | Status: AC
  Administered 2013-08-20: 650 mg via ORAL
  Filled 2013-08-20: qty 2

## 2013-08-20 NOTE — Discharge Instructions (Signed)

## 2013-08-20 NOTE — MAU Note (Signed)
Patient states she woke up at 0530 with severe pelvic pain. Has had irregular periods and has had spotting off and on. Denies nausea or vomiting.

## 2013-08-20 NOTE — MAU Provider Note (Signed)
Results for orders placed during the hospital encounter of 08/20/13 (from the past 24 hour(s))  URINALYSIS, ROUTINE W REFLEX MICROSCOPIC     Status: Abnormal   Collection Time    08/20/13  7:35 AM      Result Value Ref Range   Color, Urine YELLOW  YELLOW   APPearance HAZY (*) CLEAR   Specific Gravity, Urine 1.010  1.005 - 1.030   pH 6.0  5.0 - 8.0   Glucose, UA NEGATIVE  NEGATIVE mg/dL   Hgb urine dipstick LARGE (*) NEGATIVE   Bilirubin Urine NEGATIVE  NEGATIVE   Ketones, ur NEGATIVE  NEGATIVE mg/dL   Protein, ur NEGATIVE  NEGATIVE mg/dL   Urobilinogen, UA 0.2  0.0 - 1.0 mg/dL   Nitrite NEGATIVE  NEGATIVE   Leukocytes, UA TRACE (*) NEGATIVE  URINE MICROSCOPIC-ADD ON     Status: Abnormal   Collection Time    08/20/13  7:35 AM      Result Value Ref Range   Squamous Epithelial / LPF FEW (*) RARE   WBC, UA 0-2  <3 WBC/hpf   RBC / HPF 21-50  <3 RBC/hpf   Bacteria, UA RARE  RARE  POCT PREGNANCY, URINE     Status: None   Collection Time    08/20/13  7:51 AM      Result Value Ref Range   Preg Test, Ur NEGATIVE  NEGATIVE  WET PREP, GENITAL     Status: Abnormal   Collection Time    08/20/13  9:56 AM      Result Value Ref Range   Yeast Wet Prep HPF POC NONE SEEN  NONE SEEN   Trich, Wet Prep NONE SEEN  NONE SEEN   Clue Cells Wet Prep HPF POC NONE SEEN  NONE SEEN   WBC, Wet Prep HPF POC FEW (*) NONE SEEN  CBC WITH DIFFERENTIAL     Status: Abnormal   Collection Time    08/20/13 10:15 AM      Result Value Ref Range   WBC 19.4 (*) 4.0 - 10.5 K/uL   RBC 4.60  3.87 - 5.11 MIL/uL   Hemoglobin 13.9  12.0 - 15.0 g/dL   HCT 16.1  09.6 - 04.5 %   MCV 90.9  78.0 - 100.0 fL   MCH 30.2  26.0 - 34.0 pg   MCHC 33.3  30.0 - 36.0 g/dL   RDW 40.9  81.1 - 91.4 %   Platelets 214  150 - 400 K/uL   Neutrophils Relative % 82 (*) 43 - 77 %   Neutro Abs 15.9 (*) 1.7 - 7.7 K/uL   Lymphocytes Relative 12  12 - 46 %   Lymphs Abs 2.3  0.7 - 4.0 K/uL   Monocytes Relative 5  3 - 12 %   Monocytes Absolute  1.0  0.1 - 1.0 K/uL   Eosinophils Relative 1  0 - 5 %   Eosinophils Absolute 0.3  0.0 - 0.7 K/uL   Basophils Relative 0  0 - 1 %   Basophils Absolute 0.0  0.0 - 0.1 K/uL  COMPREHENSIVE METABOLIC PANEL     Status: Abnormal   Collection Time    08/20/13 10:15 AM      Result Value Ref Range   Sodium 136 (*) 137 - 147 mEq/L   Potassium 4.6  3.7 - 5.3 mEq/L   Chloride 102  96 - 112 mEq/L   CO2 26  19 - 32 mEq/L   Glucose, Bld 97  70 - 99 mg/dL   BUN 12  6 - 23 mg/dL   Creatinine, Ser 1.610.63  0.50 - 1.10 mg/dL   Calcium 8.9  8.4 - 09.610.5 mg/dL   Total Protein 7.0  6.0 - 8.3 g/dL   Albumin 3.4 (*) 3.5 - 5.2 g/dL   AST 24  0 - 37 U/L   ALT 23  0 - 35 U/L   Alkaline Phosphatase 67  39 - 117 U/L   Total Bilirubin 0.4  0.3 - 1.2 mg/dL   GFR calc non Af Amer >90  >90 mL/min   GFR calc Af Amer >90  >90 mL/min   Anion gap 8  5 - 15    Consulted with Dr. Normand Sloopillard CT scan report indicates no significant finding to support the patients s/s.   DC to home with instructions to FU in 2 weeks in the office   Truett Mcfarlan, CNM, MSN 08/20/2013. 4:36 PM

## 2013-08-20 NOTE — MAU Provider Note (Signed)
Kim Sullivan is a 33 y.o.  Pt presents to MAU c/o severe abd pain that started this morning at 0530.  She denies N/V.  She reports a small spot of pink discharge x1.  She reports her periods are always irregular and she has been spotting of and on for the last 3 week or so.  She believes her last true period was 4 weeks ago.  She is not taking any birth control nor is she trying to get pregnant.  She's not taking any medications and her last BM was yesterday at 10pm and has not pain or burning with urination.    History     Patient Active Problem List   Diagnosis Date Noted  . Vaginal delivery 10/04/2012  . Miscarriage 08/12/2011  . Threatened abortion 08/08/2011  . UTI (lower urinary tract infection) 08/08/2011    Chief Complaint  Patient presents with  . Pelvic Pain   HPI  OB History   Grav Para Term Preterm Abortions TAB SAB Ect Mult Living   2 1 1  0 1 0 1 0 0 1      Past Medical History  Diagnosis Date  . No pertinent past medical history   . Irregular heart beat   . Infection     UTI  . PID (pelvic inflammatory disease)   . Gestational diabetes     Past Surgical History  Procedure Laterality Date  . Tonsillectomy      Family History  Problem Relation Age of Onset  . Heart disease Father   . Hypertension Father   . Hypertension Maternal Grandfather   . Heart disease Maternal Grandfather   . Other Neg Hx   . Lupus Mother   . Lupus Maternal Aunt   . Heart disease Maternal Grandmother   . COPD Paternal Grandmother     liver and lung  . Heart disease Paternal Grandfather     History  Substance Use Topics  . Smoking status: Heavy Tobacco Smoker -- 0.50 packs/day for 16 years  . Smokeless tobacco: Never Used  . Alcohol Use: No     Comment: occasionally/'not since pregnancy    Allergies: No Known Allergies  Prescriptions prior to admission  Medication Sig Dispense Refill  . guaiFENesin (ROBITUSSIN) 100 MG/5ML liquid Take 200 mg by mouth 2 (two)  times daily as needed for cough.      Marland Kitchen. ibuprofen (ADVIL,MOTRIN) 200 MG tablet Take 800 mg by mouth every 4 (four) hours as needed for mild pain or moderate pain.        Review of Systems  Constitutional: Negative for fever.  HENT: Negative.   Eyes: Negative.   Respiratory: Negative.   Gastrointestinal: Positive for abdominal pain.  Genitourinary: Negative.   Musculoskeletal: Negative.   Skin: Negative.   Neurological: Negative.   Endo/Heme/Allergies: Negative.   Psychiatric/Behavioral: Negative.     Physical Exam   Blood pressure 108/66, pulse 82, temperature 98.4 F (36.9 C), temperature source Oral, resp. rate 18, height 5\' 10"  (1.778 m), weight 325 lb 3.2 oz (147.51 kg), not currently breastfeeding.  Physical Exam  Constitutional: She is oriented to person, place, and time. She appears well-developed.  Neck: Normal range of motion.  Cardiovascular: Normal rate.   GI: Soft. She exhibits no distension and no mass. There is tenderness. There is rebound and guarding.  Musculoskeletal: Normal range of motion.  Neurological: She is alert and oriented to person, place, and time.  Skin: Skin is warm and dry.  Psychiatric: Her  behavior is normal.    Chest: CTA Heart: RRR w/o murmer Ext:  WNL ABD: Soft, non tender to palpation, no rebound or guarding SVE:   ED Course  Assessment:    Plan:  Labs: CBC w/Diff, CMET, GC/CT Complete ABD and Pelvic US  Will Consult w/Dr. Maureen Ralphs [name], CNM, MSN 08/20/2013. 9:10 AM

## 2013-08-21 LAB — GC/CHLAMYDIA PROBE AMP
CT PROBE, AMP APTIMA: NEGATIVE
GC PROBE AMP APTIMA: NEGATIVE

## 2013-09-24 ENCOUNTER — Emergency Department (HOSPITAL_COMMUNITY): Payer: Medicaid Other

## 2013-09-24 ENCOUNTER — Encounter (HOSPITAL_COMMUNITY): Payer: Self-pay | Admitting: Emergency Medicine

## 2013-09-24 ENCOUNTER — Emergency Department (HOSPITAL_COMMUNITY)
Admission: EM | Admit: 2013-09-24 | Discharge: 2013-09-24 | Disposition: A | Discharge status: Home / Self Care | Payer: Medicaid Other | Attending: Emergency Medicine | Admitting: Emergency Medicine

## 2013-09-24 DIAGNOSIS — Z8679 Personal history of other diseases of the circulatory system: Secondary | ICD-10-CM | POA: Insufficient documentation

## 2013-09-24 DIAGNOSIS — F17200 Nicotine dependence, unspecified, uncomplicated: Secondary | ICD-10-CM | POA: Insufficient documentation

## 2013-09-24 DIAGNOSIS — Z87440 Personal history of urinary (tract) infections: Secondary | ICD-10-CM | POA: Insufficient documentation

## 2013-09-24 DIAGNOSIS — Z8632 Personal history of gestational diabetes: Secondary | ICD-10-CM | POA: Insufficient documentation

## 2013-09-24 DIAGNOSIS — Z8742 Personal history of other diseases of the female genital tract: Secondary | ICD-10-CM | POA: Insufficient documentation

## 2013-09-24 DIAGNOSIS — J069 Acute upper respiratory infection, unspecified: Secondary | ICD-10-CM | POA: Insufficient documentation

## 2013-09-24 DIAGNOSIS — R0602 Shortness of breath: Secondary | ICD-10-CM | POA: Insufficient documentation

## 2013-09-24 MED ORDER — BENZONATATE 100 MG PO CAPS: 100.0000 mg | ORAL_CAPSULE | Freq: Three times a day (TID) | ORAL | Status: AC

## 2013-09-24 MED ORDER — ALBUTEROL SULFATE HFA 108 (90 BASE) MCG/ACT IN AERS: 2.0000 | INHALATION_SPRAY | RESPIRATORY_TRACT | Status: AC | PRN

## 2013-09-24 NOTE — ED Provider Notes (Signed)
CSN: [id number]     Arrival date & time 09/24/13  0417 History   First MD Initiated Contact with Patient 09/24/13 [phone]     Chief Complaint  Patient presents with  . Shortness of Breath  . Chills     (Consider location/radiation/quality/duration/timing/severity/associated sxs/prior Treatment) HPI Comments: 33 year old female presents with a complaint of coughing and shortness of breath. This started several days ago, gradually worsening, now associated with pain in the bilateral rib cage as well as a feeling of mild body aches, subjective fevers and chills. Nothing makes this better or worse, associated phlegm production is present. She denies tick bites rashes swelling belly pain or back pain.  The history is provided by the patient and the spouse.    Past Medical History  Diagnosis Date  . No pertinent past medical history   . Irregular heart beat   . Infection     UTI  . PID (pelvic inflammatory disease)   . Gestational diabetes    Past Surgical History  Procedure Laterality Date  . Tonsillectomy     Family History  Problem Relation Age of Onset  . Heart disease Father   . Hypertension Father   . Hypertension Maternal Grandfather   . Heart disease Maternal Grandfather   . Other Neg Hx   . Lupus Mother   . Lupus Maternal Aunt   . Heart disease Maternal Grandmother   . COPD Paternal Grandmother     liver and lung  . Heart disease Paternal Grandfather    History  Substance Use Topics  . Smoking status: Heavy Tobacco Smoker -- 0.50 packs/day for 16 years  . Smokeless tobacco: Never Used  . Alcohol Use: No     Comment: occasionally/'not since pregnancy   OB History   Grav Para Term Preterm Abortions TAB SAB Ect Mult Living   0 1 0 1 0 0 1     Review of Systems  All other systems reviewed and are negative.     Allergies  Review of patient's allergies indicates no known allergies.  Home Medications   Prior to Admission medications   Medication Sig  Start Date End Date Taking? Authorizing Provider  albuterol (PROVENTIL HFA;VENTOLIN HFA) 108 (90 BASE) MCG/ACT inhaler Inhale 2 puffs into the lungs every 4 (four) hours as needed for wheezing or shortness of breath. 09/24/13   Vida Roller, MD  benzonatate (TESSALON) 100 MG capsule Take 1 capsule (100 mg total) by mouth every 8 (eight) hours. 09/24/13   Vida Roller, MD   BP 102/52  Pulse 72  Temp(Src) 98.5 F (36.9 C) (Oral)  Resp 18  Ht  (1.803 m)  Wt 275 lb (124.739 kg)  BMI 38.37 kg/m2  SpO2 97%  LMP 08/30/2013 Physical Exam  Nursing note and vitals reviewed. Constitutional: She appears well-developed and well-nourished. No distress.  HENT:  Head: Normocephalic and atraumatic.  Mouth/Throat: Oropharynx is clear and moist. No oropharyngeal exudate.  Eyes: Conjunctivae and EOM are normal. Pupils are equal, round, and reactive to light. Right eye exhibits no discharge. Left eye exhibits no discharge. No scleral icterus.  Neck: Normal range of motion. Neck supple. No JVD present. No thyromegaly present.  Cardiovascular: Normal rate, regular rhythm, normal heart sounds and intact distal pulses.  Exam reveals no gallop and no friction rub.   No murmur heard. Pulmonary/Chest: Effort normal and breath sounds normal. No respiratory distress. She has no wheezes. She has no rales.  Abdominal: Soft. Bowel  sounds are normal. She exhibits no distension and no mass. There is no tenderness.  Musculoskeletal: Normal range of motion. She exhibits no edema and no tenderness.  Lymphadenopathy:    She has no cervical adenopathy.  Neurological: She is alert. Coordination normal.  Skin: Skin is warm and dry. No rash noted. No erythema.  Psychiatric: She has a normal mood and affect. Her behavior is normal.    ED Course  Procedures (including critical care time) Labs Review Labs Reviewed - No data to display  Imaging Review Dg Chest 2 View  09/24/2013   CLINICAL DATA:  Shortness of  breath and chest discomfort for 2 days.  EXAM: CHEST  2 VIEW  COMPARISON:  None.  FINDINGS: The heart size and mediastinal contours are within normal limits. Both lungs are clear. The visualized skeletal structures are unremarkable.  IMPRESSION: No active cardiopulmonary disease.   Electronically Signed   By: Burman Nieves M.D.   On: 09/24/2013 04:55      MDM   Final diagnoses:  Upper respiratory infection    This time the patient has no wheezing, no rales, speaking in full sentences and does not appear to be in any respiratory distress. She does have mild sinus drainage, there is no signs of significant sinusitis, pharyngitis or rhinosinusitis. She has a supple neck with no lymphadenopathy and a soft abdomen without tenderness. Chest x-ray to rule out pneumonia.  Xray normal, VS unremarkable - home with following meds.  Meds given in ED:  Medications - No data to display  New Prescriptions   ALBUTEROL (PROVENTIL HFA;VENTOLIN HFA) 108 (90 BASE) MCG/ACT INHALER    Inhale 2 puffs into the lungs every 4 (four) hours as needed for wheezing or shortness of breath.   BENZONATATE (TESSALON) 100 MG CAPSULE    Take 1 capsule (100 mg total) by mouth every 8 (eight) hours.      Vida Roller, MD 09/24/13 [phone]

## 2013-09-24 NOTE — Discharge Instructions (Signed)
Xray normal - likely upper respiratory infection / bronchitis - see attached reading instructions.  Please call your doctor for a followup appointment within 24-48 hours. When you talk to your doctor please let them know that you were seen in the emergency department and have them acquire all of your records so that they can discuss the findings with you and formulate a treatment plan to fully care for your new and ongoing problems.    Emergency Department Resource Guide 1) Find a Doctor and Pay Out of Pocket Although you won't have to find out who is covered by your insurance plan, it is a good idea to ask around and get recommendations. You will then need to call the office and see if the doctor you have chosen will accept you as a new patient and what types of options they offer for patients who are self-pay. Some doctors offer discounts or will set up payment plans for their patients who do not have insurance, but you will need to ask so you aren't surprised when you get to your appointment.  2) Contact Your Local Health Department Not all health departments have doctors that can see patients for sick visits, but many do, so it is worth a call to see if yours does. If you don't know where your local health department is, you can check in your phone book. The CDC also has a tool to help you locate your state's health department, and many state websites also have listings of all of their local health departments.  3) Find a Walk-in Clinic If your illness is not likely to be very severe or complicated, you may want to try a walk in clinic. These are popping up all over the country in pharmacies, drugstores, and shopping centers. They're usually staffed by nurse practitioners or physician assistants that have been trained to treat common illnesses and complaints. They're usually fairly quick and inexpensive. However, if you have serious medical issues or chronic medical problems, these are probably not  your best option.  No Primary Care Doctor: - Call Health Connect at  [phone] - they can help you locate a primary care doctor that  accepts your insurance, provides certain services, etc. - Physician Referral Service- [phone]  Chronic Pain Problems: Organization         Address  Phone   Notes  [facility]  [phone] Patients need to be referred by their primary care doctor.   Medication Assistance: Organization         Address  Phone   Notes  [facility] Medication [facility] [address] [place], [place] [address] [phone] --Must be a resident of [facility] -- Must have NO insurance coverage whatsoever (no Medicaid/ Medicare, etc.) -- The pt. MUST have a primary care doctor that directs their care regularly and follows them in the community   [facility]  [phone]   [employer]  [phone]    Agencies that provide inexpensive medical care: Organization         Address  Phone   Notes  [facility]  [phone]   [facility]    [phone]   [facility] [address] [place], [place] [address] [phone]   [facility] [address] [place], [place] [phone]   Planned Parenthood    [phone]   [facility]    [phone]  [facility] and [place]  [address], [place] Phone:  [phone], Fax:  [phone] Hours of Operation:  9 am - 6 pm, M-F.  Also accepts Medicaid/Medicare and self-pay.  [facility] [place] [address], [address], [place] Phone: [phone], Fax: [phone]. Hours of Operation:  8:30 am - 5:30 pm, M-F.  Also accepts Medicaid and self-pay.  [facility] [place] [address], [place] Phone: [phone]   [place], [place], [place] [phone], Ext. [phone] Mondays & Thursdays: 7-9 AM.  First  15 patients are seen on a first come, first serve basis.    [place] Providers:  Organization         Address  Phone   Notes  [facility] [address], [place] [phone] Also accepts self-pay patients.  [facility] [id number] [place], [place]  [phone]   [place], [address], [place] [phone]   [facility] [address] [place] [phone]   Lucianne Lei [address], [place]   [phone] Only accepts [place] Access [place] patients after they have their name applied to their card.   Self-Pay (no insurance) in [facility]:  Organization         Address  Phone   Notes  Sickle Cell Patients, [facility] Internal Medicine [place] [phone]   [facility] [place] [phone]   [facility] [place]  [place], [place], [place] [phone]   [facility]/Dr. Osei-Bonsu  [address], [place] or [address], [place] [phone] Phone number for both [place] and [place] locations is the same.  [facility] [address], [place] [phone]   [facility] [address] [place] or [address] [phone]   [facility] [address], [place] [phone], phone; [phone], fax Sees patients 1st and 3rd Saturday of every month.  Must not qualify for public or private insurance (i.e. Medicaid, Medicare, [place] Health Choice, Veterans' Benefits)  Household income should be no more than 200% of the poverty level The clinic cannot treat you if you are pregnant or think you are pregnant  Sexually transmitted diseases are not treated at the clinic.    Dental  Care: Organization         Address  Phone  Notes  [facility] [place] [phone] Accepts children up to age 70 who are enrolled in [place] or [place]; pregnant women with a Medicaid card; and children who have applied for Medicaid or [place] Health Choice, but were declined, whose parents can pay a reduced fee at time of service.  [facility] of [facility]  [address], [place] [phone] Accepts children up to age 69 who are enrolled in [place] or [place]; pregnant women with a Medicaid card; and children who have applied for Medicaid or [place] Health Choice, but were declined, whose parents can pay a reduced fee at time of service.  [place]  [phone]  [place] [phone] Patients are seen by appointment only. Walk-ins are not accepted. [place] will see patients 26 years of age and older. Monday - Tuesday (8am-5pm) Most Wednesdays (8:30-5pm) $30 per visit, cash only  [facility] Adult Dental Access PROGRAM  [address], [facility] [phone] Patients are seen by appointment only. Walk-ins are not accepted. [place] will see patients 62 years of age and older. One Wednesday Evening (Monthly: Volunteer Based).  $30 per visit, cash only  [place]  [phone] for adults; Children under age 39, call Graduate Pediatric Dentistry at [phone]. Children aged 75-14, please call [phone] to request a pediatric application.  Dental services are provided in all areas of dental care including fillings, crowns and bridges, complete and partial dentures, implants, gum treatment, root canals, and extractions. Preventive care is also provided. Treatment is provided to both adults and children. Patients are selected via a lottery and there is often a waiting list.   [facility] [address] [place]  [phone] [email or web address]   [facility] [address] [place], [place] [phone], Ext. [phone] Second and Fourth Thursday of each month, opens at 6:30 AM; Clinic ends at 9 AM.  Patients are seen on a first-come first-served basis, and a limited number are seen during each clinic.   [facility]  [address] Hillard Danker [place], [place] [phone]   Eligibility Requirements You must have lived in [place], [place], or [place] counties for at least the last three months.   You cannot be eligible for state or federal sponsored [employer], including [employer], [place], or [employer].   You generally cannot be eligible for healthcare insurance through your employer.    How to apply: Eligibility screenings are held every Tuesday and Wednesday afternoon from 1:00 pm until 4:00 pm. You do not need an appointment for the interview!  [facility] [address] [place], [place]   [place]  [place] Department  [place]  [phone]    Behavioral Health Resources in the Community: Intensive Outpatient Programs Organization         Address  Phone  Notes  [place]. [address] [place], [place] [phone]   [facility]patient [address] [place], [place]   ADS: [facility] [address], [place], [place]   [place] [address]  [place], [place] or [phone]   Substance Abuse Resources Organization         Address  Phone  Notes  Alcohol and Drug Services  [phone]   [place]  [phone]   The [place]   Chinita Pester  [phone]   Residential & Outpatient Substance Abuse Program  [phone]    Psychological Services Organization         Address  Phone  Notes  [facility] [place]  [place]  [phone]   [place] [address] [place] or [phone]    Mobile Crisis Teams Organization         Address  Phone  Notes  [facility], Mobile Crisis Care Unit  [phone]   [facility]ive Psychotherapeutic Services  [address] [place], [place]  [facility] [place] [address], [address] [place] [phone]    Self-Help/Support Groups Organization         Address  Phone             Notes  [facility] [place] - variety of support groups  Jessie Call for more information  Narcotics Anonymous (NA), [facility] [address], [employer]  2 meetings at this location   Special educational needs teacher         Address  Phone  Notes  [facility]AP Residential Treatment [place],    [place]  [phone]   [facility]  [address], [place] [id number], [place], [place]   [place], [place] [phone] Admissions: 8am-3pm M-F  Incentives Substance [place] [address]    [place], [place] [phone]   [facility] [address] [place], [place], [place]   [facility] [address]  [place], [place]   [facility] Programs - Intensive Outpatient [address] Krist[address], [place], [place]   [facility] [place].,  [place], [place] [phone] or [phone]   [facility] Services (RTS) [address] [place], [place] Accepts Medicaid  [facility] [place] [address]  [place] [phone] Substance Abuse/Addiction Treatment   [facility] Organization         Address  Phone  Notes  [facility]  [phone]   Domenic Schwab, PhD [address] [place], [place]   [phone] or [phone]   [place], [place] [phone]   [facility] [address] [place], [place] [phone] Insurance/Medicaid/sponsorship through [facility] and Families [address], [address] [place]                                    [place], [place] [phone] [place] [address], [place] [phone]    Dr. Adele Schilder  [phone]   [facility] Dept. 1) [address], [place] 2) [place] 3)  [address], [place] [phone]  [phone]   [place] [phone] or [phone] (After Hours)

## 2013-09-24 NOTE — ED Notes (Signed)
Pt reports chills that started Monday morning but has not any fevers, pt states she also had some shortness of breath that started on Wednesday. Pt states she has pain in her sides and ribs, deep breaths make her pain worse. Pt rates pain 7/10. Pt reports she also has a productive cough with yellow sputum. Pt alert and oriented no distress noted.

## 2013-11-30 ENCOUNTER — Encounter (HOSPITAL_COMMUNITY): Payer: Self-pay | Admitting: Emergency Medicine

## 2015-01-18 IMAGING — CT CT ABD-PELV W/ CM
1 of 2 series · 16 of 32 positions shown, 20 images · IV contrast (OMNIPAQUE)
Comparison: None.

CLINICAL DATA: Upper abdominal pain and left lower quadrant pain.
Vaginal spotting for 3 weeks.

EXAM:
CT ABDOMEN AND PELVIS WITH CONTRAST
TECHNIQUE: Multidetector CT imaging of the abdomen and pelvis was performed
using the standard protocol following bolus administration of
intravenous contrast.
CONTRAST:  100 mL OMNIPAQUE IOHEXOL 300 MG/ML  SOLN

[Series 2: routine abdomen/pelvis with · axial · 0.97mm/px · z∈[-551,-146]mm · 16 of 89 slices shown, 20 images]
[im 4/89  soft-tissue]
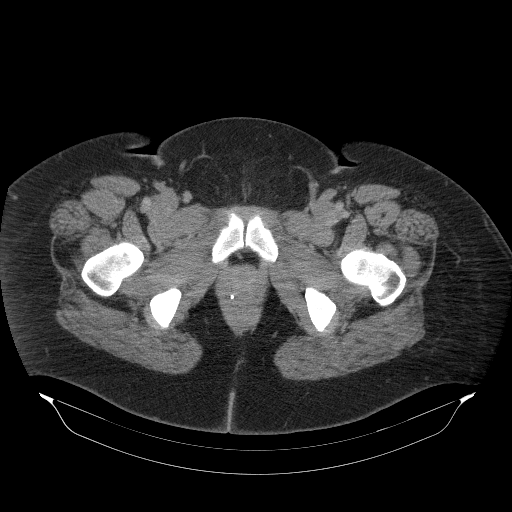
[im 4/89  bone]
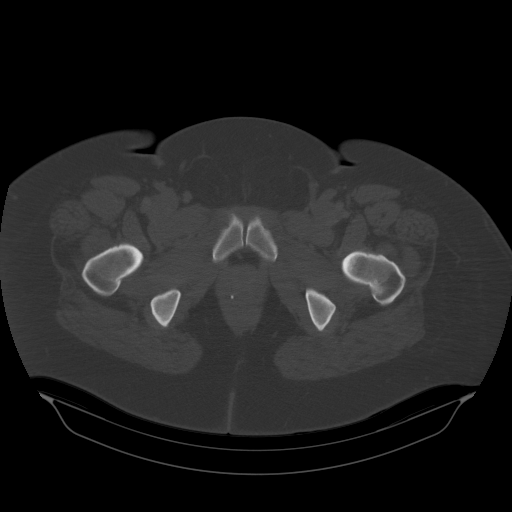
[im 11/89  soft-tissue]
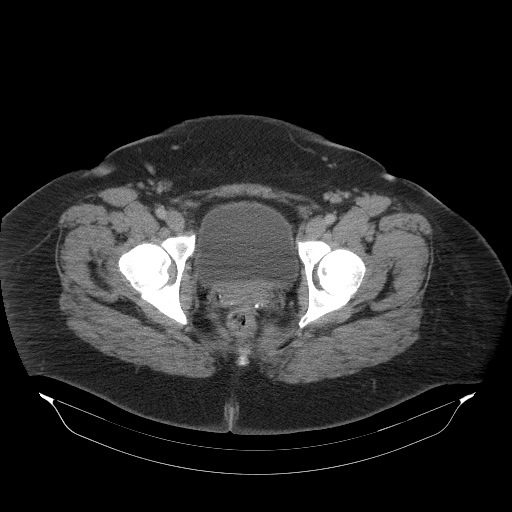
[im 18/89  soft-tissue]
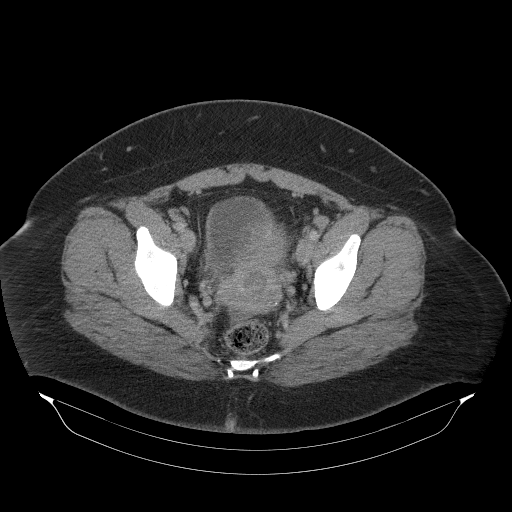
[im 25/89  soft-tissue]
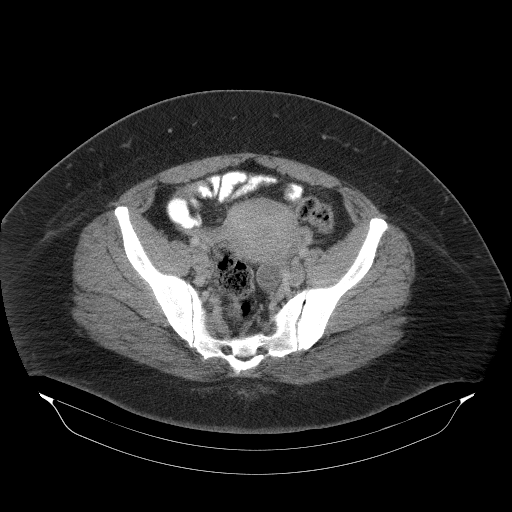
[im 29/89  soft-tissue]
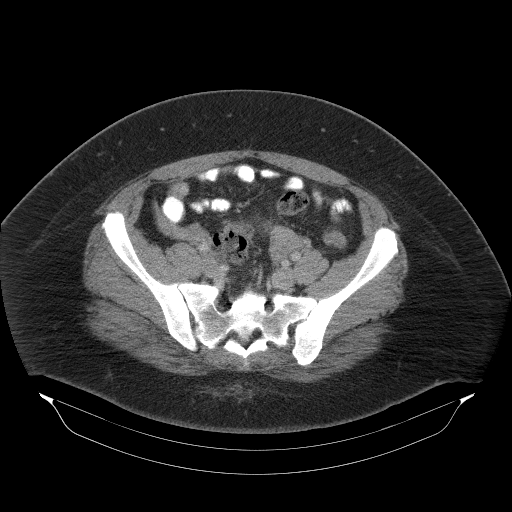
[im 36/89  soft-tissue]
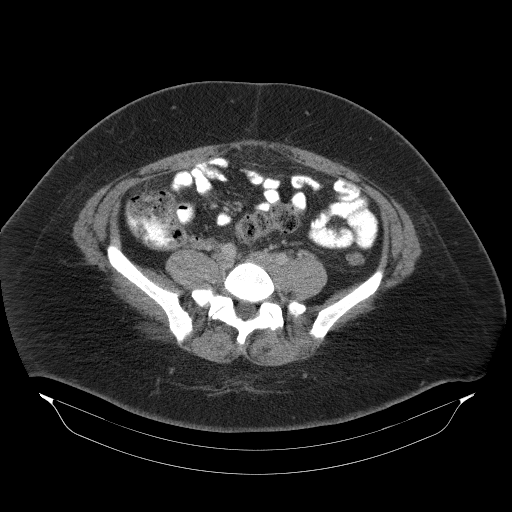
[im 43/89  soft-tissue]
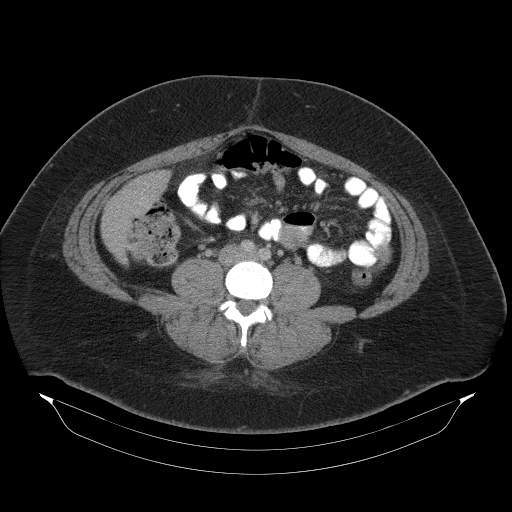
[im 46/89  soft-tissue]
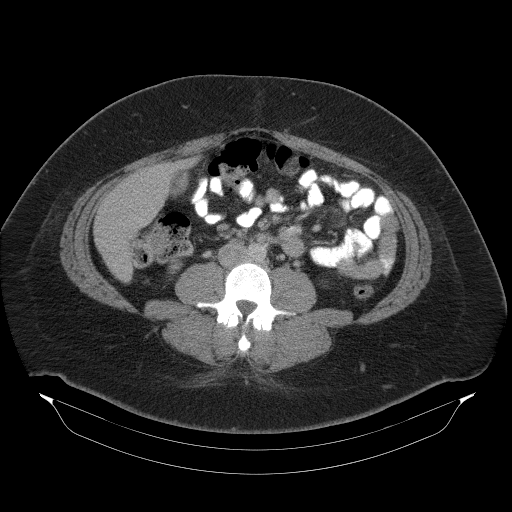
[im 53/89  soft-tissue]
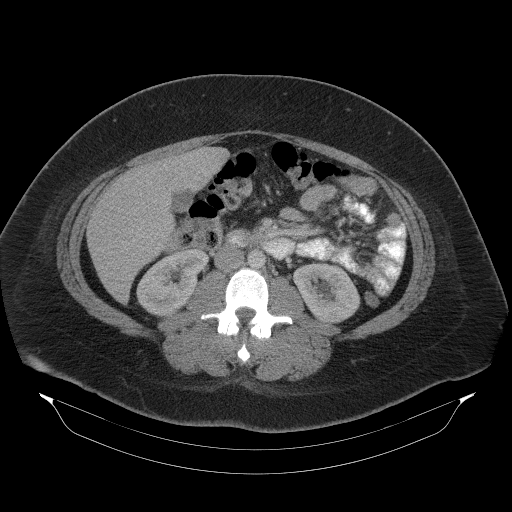
[im 53/89  bone]
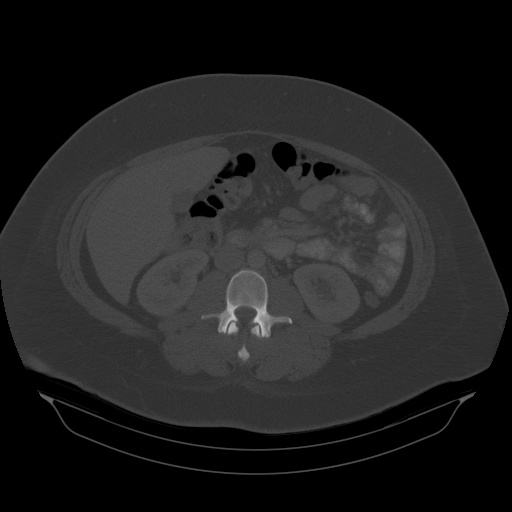
[im 60/89  soft-tissue]
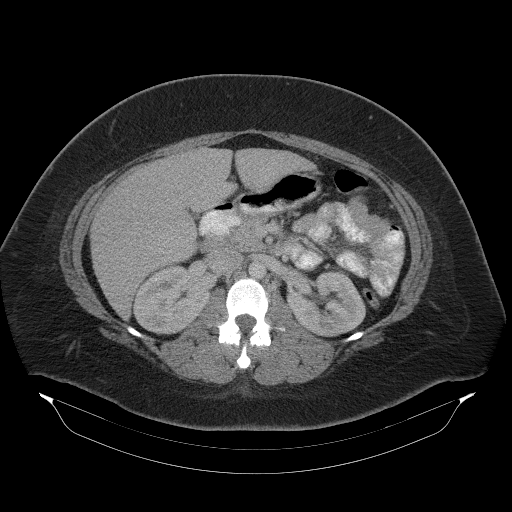
[im 67/89  soft-tissue]
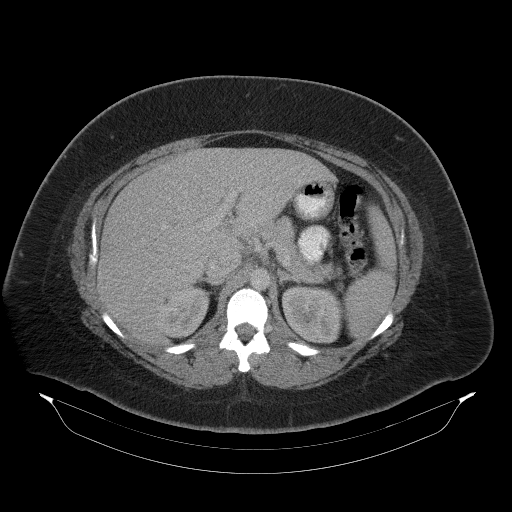
[im 71/89  soft-tissue]
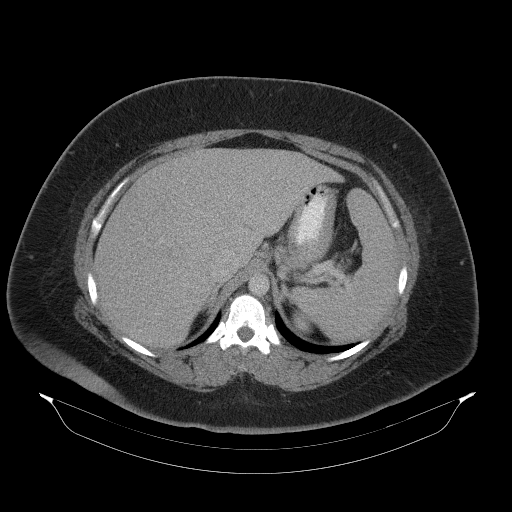
[im 74/89  lung]
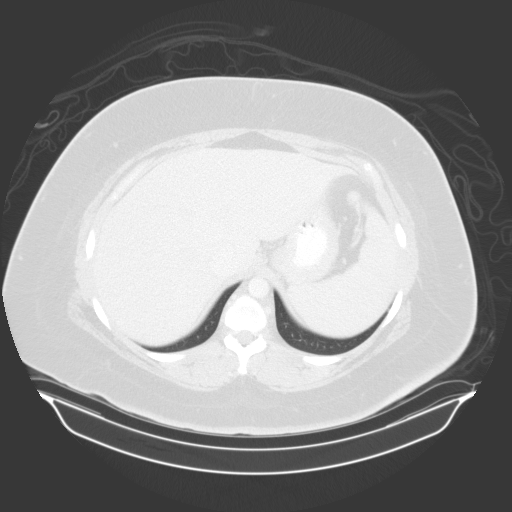
[im 78/89  soft-tissue]
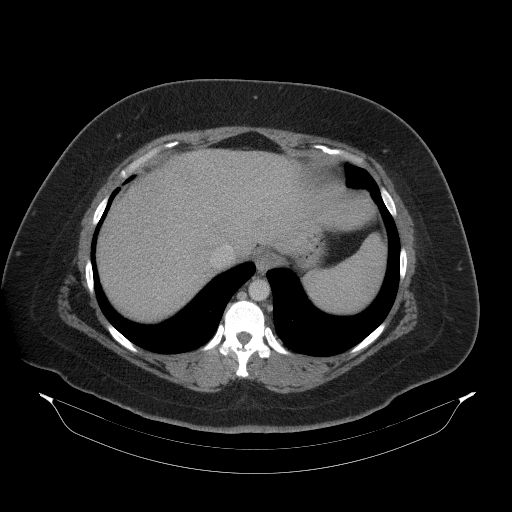
[im 78/89  lung]
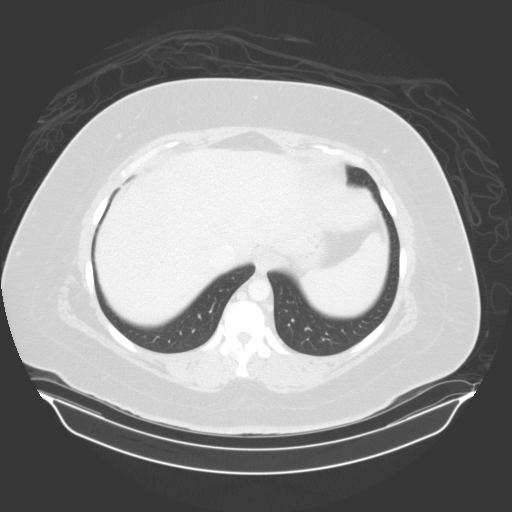
[im 81/89  lung]
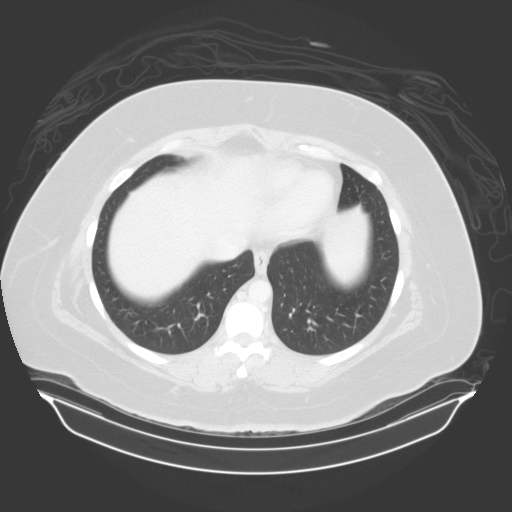
[im 85/89  soft-tissue]
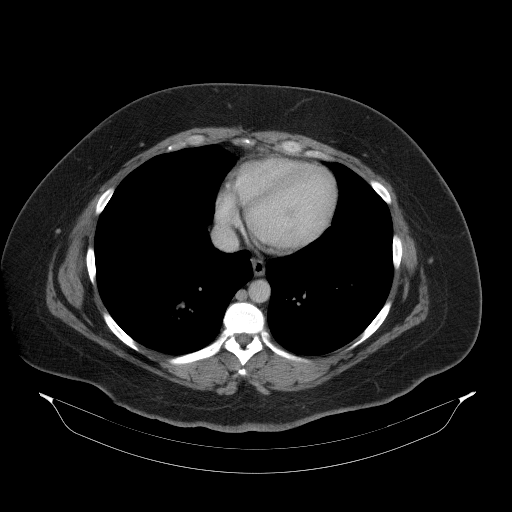
[im 85/89  lung]
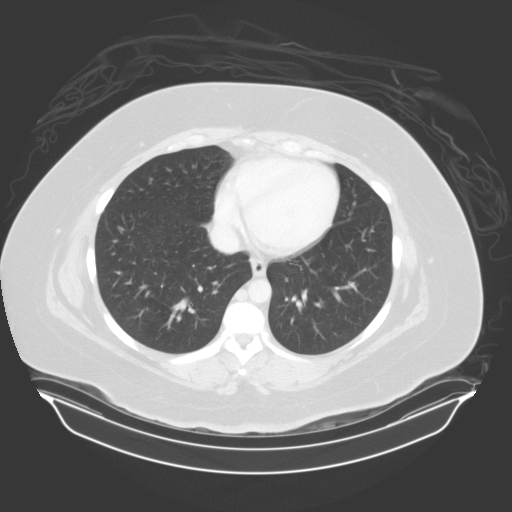

[16 of 32 positions shown; findings below may reference images not displayed]

FINDINGS: The lung bases are clear.  No pleural or pericardial effusion.

The gallbladder, liver, spleen, adrenal glands, pancreas and kidneys
appear normal. The pancreas is normal in appearance. Uterus, adnexa
and urinary bladder are unremarkable. The stomach, small and large
bowel and appendix appear normal. No lymphadenopathy or fluid is
identified. There is no focal bony abnormality is identified.
IMPRESSION: No acute finding or finding to explain the patient's symptoms.

## 2015-02-22 IMAGING — CR DG CHEST 2V
2 series · 2 of 2 positions shown · non-contrast
Comparison: None.

CLINICAL DATA: Shortness of breath and chest discomfort for 2 days.

EXAM:
CHEST  2 VIEW

[w chest pa]
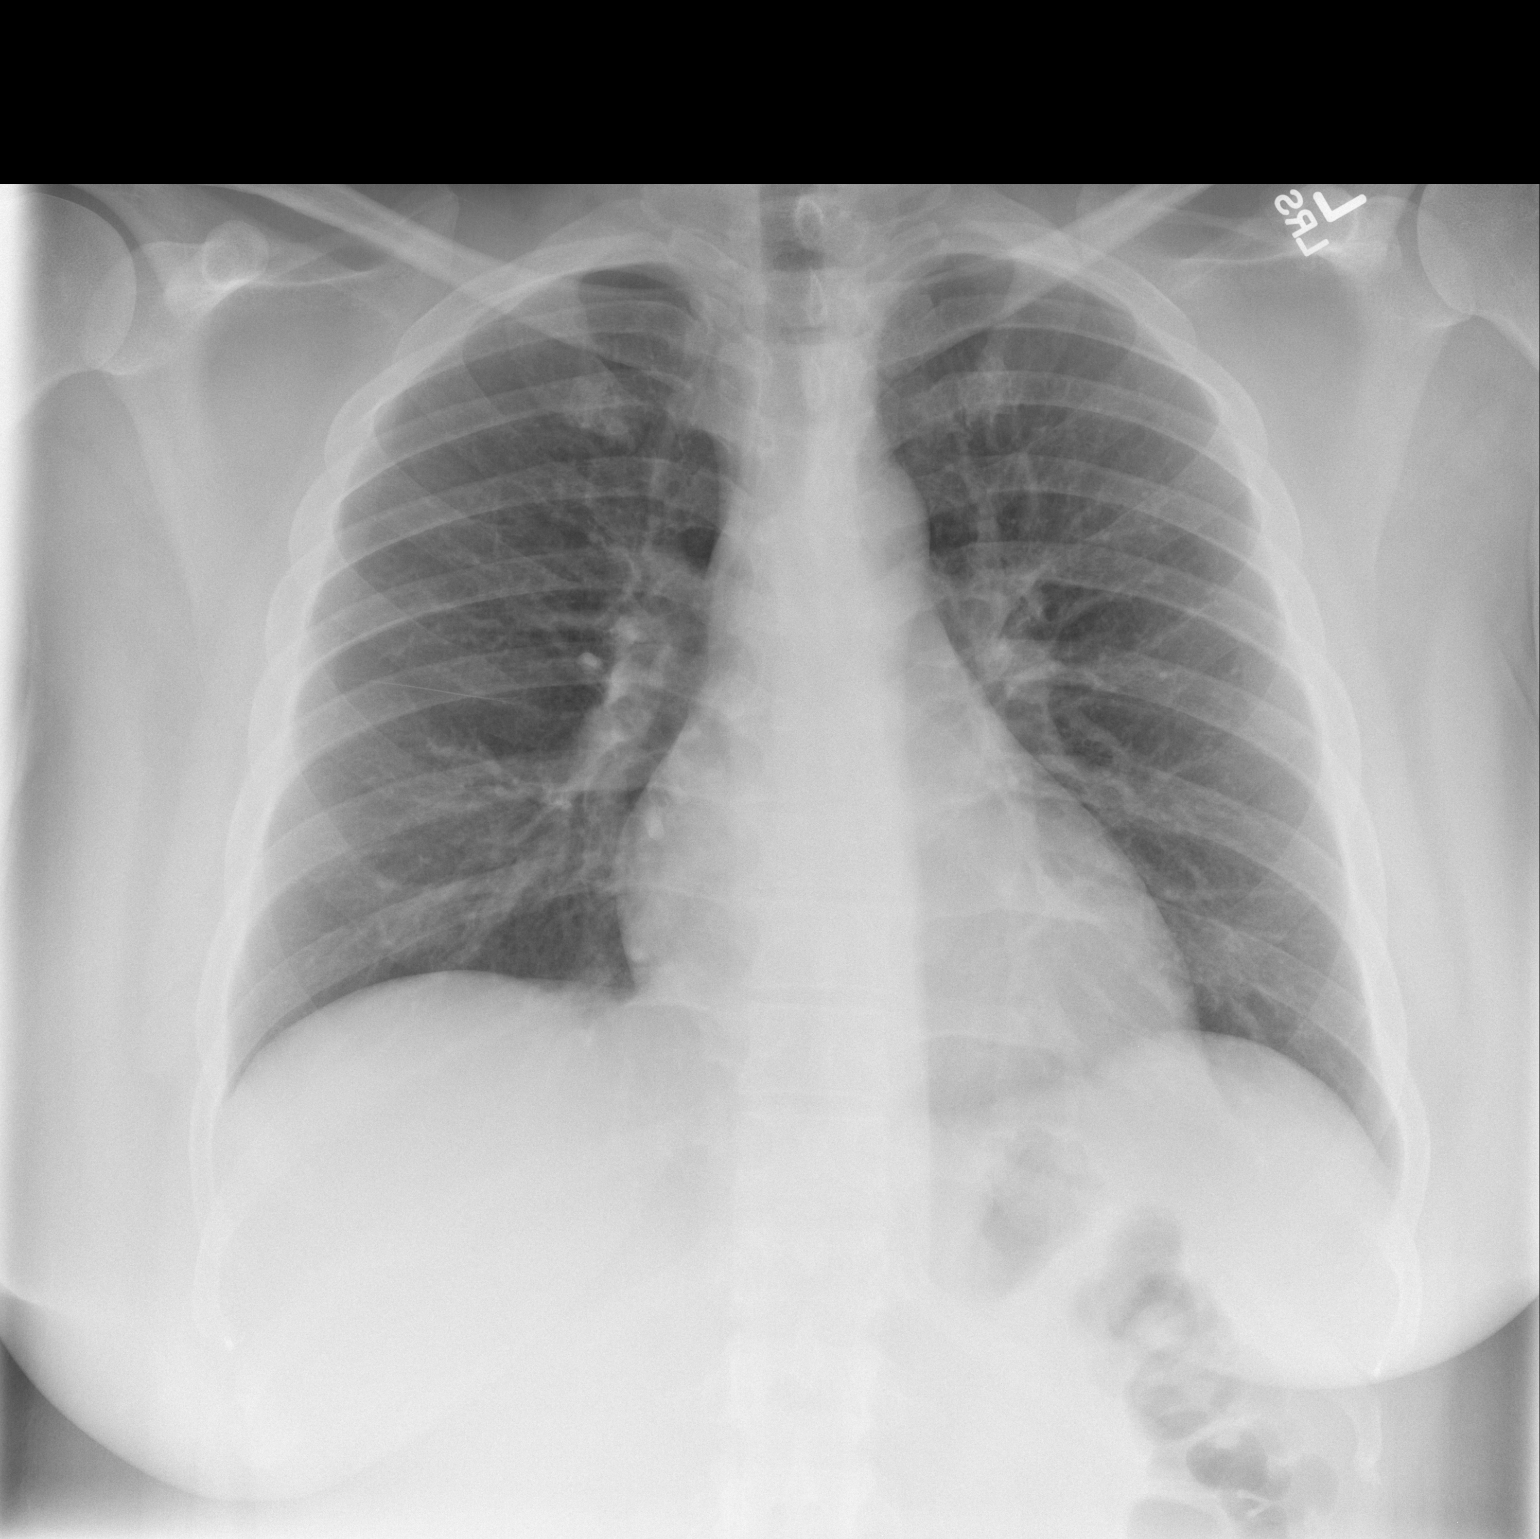

[w chest lat]
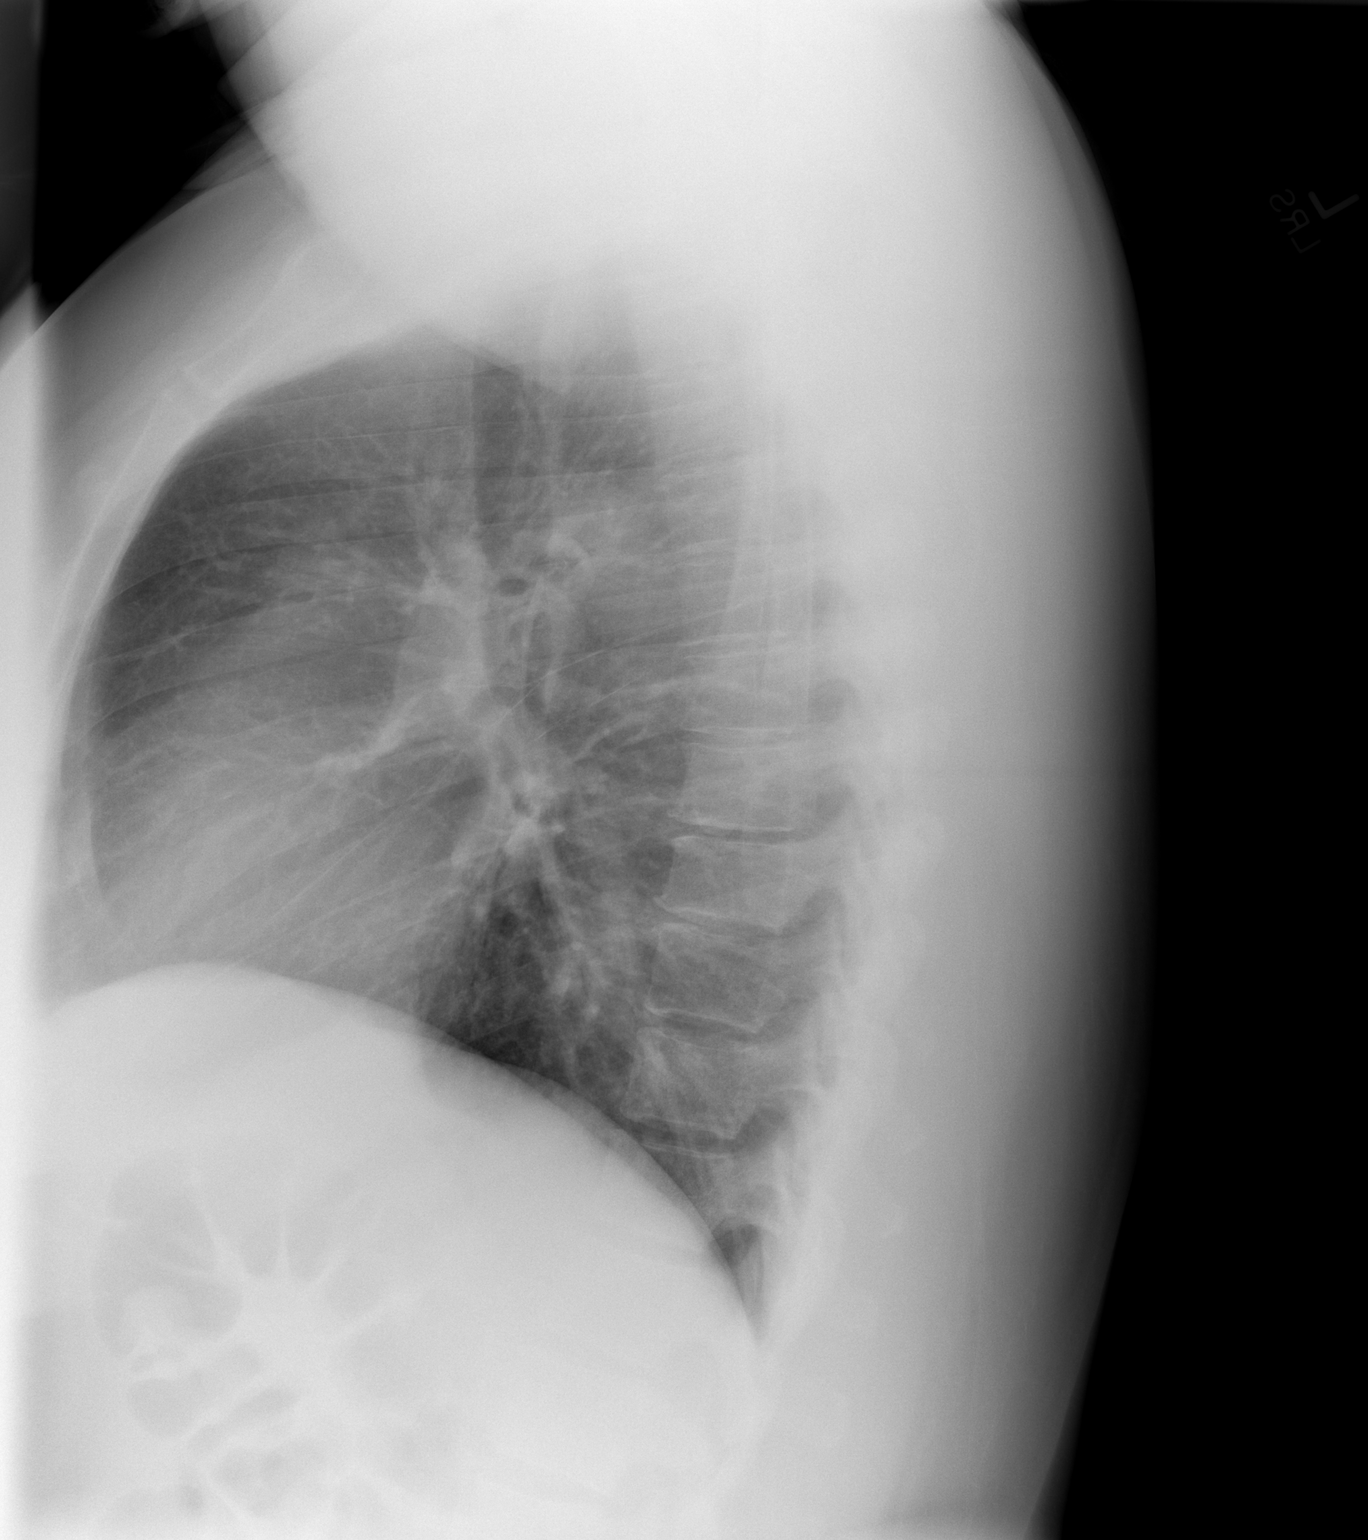

[2 of 2 positions shown; findings below may reference images not displayed]

FINDINGS: The heart size and mediastinal contours are within normal limits.
Both lungs are clear. The visualized skeletal structures are
unremarkable.
IMPRESSION: No active cardiopulmonary disease.

## 2015-06-05 ENCOUNTER — Encounter (HOSPITAL_COMMUNITY): Payer: Self-pay

## 2015-06-05 ENCOUNTER — Emergency Department (HOSPITAL_COMMUNITY)
Admission: EM | Admit: 2015-06-05 | Discharge: 2015-06-05 | Disposition: A | Discharge status: Home / Self Care | Payer: Medicaid Other | Attending: Emergency Medicine | Admitting: Emergency Medicine

## 2015-06-05 DIAGNOSIS — J028 Acute pharyngitis due to other specified organisms: Secondary | ICD-10-CM | POA: Insufficient documentation

## 2015-06-05 DIAGNOSIS — Z791 Long term (current) use of non-steroidal anti-inflammatories (NSAID): Secondary | ICD-10-CM | POA: Insufficient documentation

## 2015-06-05 DIAGNOSIS — B9789 Other viral agents as the cause of diseases classified elsewhere: Secondary | ICD-10-CM | POA: Insufficient documentation

## 2015-06-05 DIAGNOSIS — Z7951 Long term (current) use of inhaled steroids: Secondary | ICD-10-CM | POA: Insufficient documentation

## 2015-06-05 DIAGNOSIS — J029 Acute pharyngitis, unspecified: Secondary | ICD-10-CM

## 2015-06-05 DIAGNOSIS — F17210 Nicotine dependence, cigarettes, uncomplicated: Secondary | ICD-10-CM | POA: Insufficient documentation

## 2015-06-05 LAB — RAPID STREP SCREEN (MED CTR MEBANE ONLY): Streptococcus, Group A Screen (Direct): NEGATIVE

## 2015-06-05 MED ORDER — HYDROCODONE-ACETAMINOPHEN 7.5-325 MG/15ML PO SOLN: 15.0000 mL | Freq: Three times a day (TID) | ORAL | Status: AC | PRN

## 2015-06-05 MED ORDER — NAPROXEN 500 MG PO TABS
500.0000 mg | ORAL_TABLET | Freq: Two times a day (BID) | ORAL | Status: AC
Start: 2015-06-05 — End: ?

## 2015-06-05 NOTE — Discharge Instructions (Signed)

## 2015-06-05 NOTE — ED Provider Notes (Signed)
CSN: [id number]     Arrival date & time 06/05/15  4098 History   First MD Initiated Contact with Patient 06/05/15 0445     Chief Complaint  Patient presents with  . Sore Throat     (Consider location/radiation/quality/duration/timing/severity/associated sxs/prior Treatment) HPI Comments: States son had strep 1 month ago. No other sick contacts.  Patient is a 35 y.o. female presenting with pharyngitis. The history is provided by the patient. No language interpreter was used.  Sore Throat This is a new problem. Episode onset: 5 days ago. The problem occurs constantly. The problem has been gradually worsening. Associated symptoms include coughing and a sore throat. Pertinent negatives include no fever, rash or vomiting. The symptoms are aggravated by swallowing. Treatments tried: Dayquil and Nyquil. The treatment provided no relief.    Past Medical History  Diagnosis Date  . No pertinent past medical history   . Irregular heart beat   . Infection     UTI  . PID (pelvic inflammatory disease)   . Gestational diabetes    Past Surgical History  Procedure Laterality Date  . Tonsillectomy     Family History  Problem Relation Age of Onset  . Heart disease Father   . Hypertension Father   . Hypertension Maternal Grandfather   . Heart disease Maternal Grandfather   . Other Neg Hx   . Lupus Mother   . Lupus Maternal Aunt   . Heart disease Maternal Grandmother   . COPD Paternal Grandmother     liver and lung  . Heart disease Paternal Grandfather    Social History  Substance Use Topics  . Smoking status: Heavy Tobacco Smoker -- 0.50 packs/day for 16 years    Types: Cigarettes  . Smokeless tobacco: Never Used  . Alcohol Use: Yes     Comment: socially   OB History    Gravida Para Term Preterm AB TAB SAB Ectopic Multiple Living   0 1 0 1 0 0 1      Review of Systems  Constitutional: Negative for fever.  HENT: Positive for sore throat.   Respiratory: Positive for cough.    Gastrointestinal: Negative for vomiting.  Skin: Negative for rash.  All other systems reviewed and are negative.   Allergies  Review of patient's allergies indicates no known allergies.  Home Medications   Prior to Admission medications   Medication Sig Start Date End Date Taking? Authorizing Provider  ibuprofen (ADVIL,MOTRIN) 200 MG tablet Take 600 mg by mouth every 6 (six) hours as needed for moderate pain.   Yes Historical Provider, MD  Pseudoephedrine-APAP-DM (DAYQUIL MULTI-SYMPTOM COLD/FLU PO) Take 30 mLs by mouth every 6 (six) hours as needed (cough).   Yes Historical Provider, MD  albuterol (PROVENTIL HFA;VENTOLIN HFA) 108 (90 BASE) MCG/ACT inhaler Inhale 2 puffs into the lungs every 4 (four) hours as needed for wheezing or shortness of breath. Patient not taking: Reported on 06/05/2015 09/24/13   Eber Hong, MD  benzonatate (TESSALON) 100 MG capsule Take 1 capsule (100 mg total) by mouth every 8 (eight) hours. Patient not taking: Reported on 06/05/2015 09/24/13   Eber Hong, MD  HYDROcodone-acetaminophen (HYCET) 7.5-325 mg/15 ml solution Take 15 mLs by mouth every 8 (eight) hours as needed for moderate pain or severe pain (Do not drink or drive after taking.). 06/05/15   Antony Madura, PA-C  naproxen (NAPROSYN) 500 MG tablet Take 1 tablet (500 mg total) by mouth 2 (two) times daily. 06/05/15   Antony Madura, PA-C  BP 120/82 mmHg  Pulse 69  Temp(Src) 98.4 F (36.9 C) (Oral)  Resp 19  Ht 5\' 11"  (1.803 m)  Wt 154.223 kg  BMI 47.44 kg/m2  SpO2 97%  LMP 05/14/2015 (Approximate)   Physical Exam  Constitutional: She is oriented to person, place, and time. She appears well-developed and well-nourished. No distress.  HENT:  Head: Normocephalic and atraumatic.  Right Ear: External ear and ear canal normal.  Left Ear: External ear and ear canal normal.  Nose: Mucosal edema (mild) present. No rhinorrhea.  Mouth/Throat: Uvula is midline and mucous membranes are normal. Posterior  oropharyngeal erythema present. No posterior oropharyngeal edema or tonsillar abscesses.  Moderate erythema to b/l TMs. Cone of light and landmarks intact. No TM perforation. Suspect normal anatomy.  Eyes: Conjunctivae and EOM are normal. No scleral icterus.  Neck: Normal range of motion.  No nuchal rigidity or meningismus.   Pulmonary/Chest: Effort normal. No respiratory distress.  Musculoskeletal: Normal range of motion.  Neurological: She is alert and oriented to person, place, and time.  Skin: Skin is warm and dry. No rash noted. She is not diaphoretic. No erythema. No pallor.  Psychiatric: She has a normal mood and affect. Her behavior is normal.  Nursing note and vitals reviewed.   ED Course  Procedures (including critical care time) Labs Review Labs Reviewed  RAPID STREP SCREEN (NOT AT [facility])  CULTURE, GROUP A STREP [facility])    Imaging Review No results found.   I have personally reviewed and evaluated these images and lab results as part of my medical decision-making.   EKG Interpretation None      MDM   Final diagnoses:  Viral pharyngitis    Patient afebrile without tonsillar exudate, negative strep. Presents with dysphagia; diagnosis of viral pharyngitis. No abx indicated. Will d/c with symptomatic tx for pain. Presentation not concerning for PTA or infxn spread to soft tissue. No trismus or uvula deviation. Specific return precautions discussed. Patient agreeable to plan with no unaddressed concerns; discharged in good condition.   Filed Vitals:   06/05/15 0418  BP: 120/82  Pulse: 69  Temp: 98.4 F (36.9 C)  TempSrc: Oral  Resp: 19  Height: 5\' 11"  (1.803 m)  Weight: 154.223 kg  SpO2: 97%      Antony MaduraKelly Maalik Pinn, PA-C 06/05/15 0549  Melene Planan Floyd, DO 06/05/15 0602

## 2015-06-05 NOTE — ED Notes (Signed)
Patient c/o sore throat since Tuesday. Patient states that has difficulty swallowing due to soreness.  Patient states has had fevers off and on.  Last temp was yesterday at 7pm at 101.00.  Patient reports taking 600mg  ibuprofen for fever at 7pm yesterday.  Patient rates pain 7/10.  Breathing even and unlabored.  NAD at this time.

## 2015-06-07 LAB — CULTURE, GROUP A STREP (THRC)

## 2016-06-29 ENCOUNTER — Encounter (HOSPITAL_COMMUNITY): Payer: Self-pay | Admitting: Emergency Medicine

## 2016-06-29 ENCOUNTER — Emergency Department (HOSPITAL_COMMUNITY): Payer: Self-pay

## 2016-06-29 ENCOUNTER — Emergency Department (HOSPITAL_COMMUNITY)
Admission: EM | Admit: 2016-06-29 | Discharge: 2016-06-29 | Disposition: A | Discharge status: Home / Self Care | Payer: Self-pay | Attending: Emergency Medicine | Admitting: Emergency Medicine

## 2016-06-29 DIAGNOSIS — M25562 Pain in left knee: Secondary | ICD-10-CM

## 2016-06-29 DIAGNOSIS — M25462 Effusion, left knee: Secondary | ICD-10-CM

## 2016-06-29 DIAGNOSIS — F17210 Nicotine dependence, cigarettes, uncomplicated: Secondary | ICD-10-CM | POA: Insufficient documentation

## 2016-06-29 NOTE — ED Triage Notes (Signed)
Pt reports left knee pain that began 3 weeks ago, states pain resolved and has now returned, denies any injury. Pt has brace from home on knee for support.

## 2016-06-29 NOTE — ED Notes (Signed)
Pt taken to xray 

## 2016-06-29 NOTE — ED Provider Notes (Signed)
[facility]-EMERGENCY DEPT Provider Note   CSN: [id number] Arrival date & time: 06/29/16  1243  By signing my name below, I, Linna DarnerRussell Turner, attest that this documentation has been prepared under the direction and in the presence of Lyndel SafeElizabeth Tavon Magnussen, [place]. Electronically Signed: Linna Darnerussell Turner, Scribe. 06/29/2016. 2:29 PM.  History   Chief Complaint Chief Complaint  Patient presents with  . Knee Pain   The history is provided by the patient. No language interpreter was used.    HPI Comments: Kim Fallsdrianne Sarno is a 36 y.o. female who presents to the Emergency Department complaining of intermittent, gradually worsening, left anterior knee pain radiating laterally/ proximally for two weeks. No trauma history or changes in activity or new activities.  She reports some associated swelling. She has tried Tylenol, IcyHot, and rubbing alcohol without significant improvement of her symptoms. Patient has also been wearing a knee brace with mild significant relief of her symptoms. She notes she has been ambulatory with a limp when her pain is present. Patient denies back pain, numbness/tingling, fevers, chills, or any other associated symptoms. No PCP or health insurance.  Past Medical History:  Diagnosis Date  . Gestational diabetes   . Infection    UTI  . Irregular heart beat   . No pertinent past medical history   . PID (pelvic inflammatory disease)     Patient Active Problem List   Diagnosis Date Noted  . Vaginal delivery 10/04/2012  . Miscarriage 08/12/2011  . Threatened abortion 08/08/2011  . UTI (lower urinary tract infection) 08/08/2011    Past Surgical History:  Procedure Laterality Date  . TONSILLECTOMY      OB History    Gravida Para Term Preterm AB Living   2 1 1  0 1 1   SAB TAB Ectopic Multiple Live Births   1 0 0 0 1       Home Medications    Prior to Admission medications   Medication Sig Start Date End Date Taking? Authorizing Provider  albuterol (PROVENTIL  HFA;VENTOLIN HFA) 108 (90 BASE) MCG/ACT inhaler Inhale 2 puffs into the lungs every 4 (four) hours as needed for wheezing or shortness of breath. Patient not taking: Reported on 06/05/2015 09/24/13   Eber HongMiller, Brian, MD  benzonatate (TESSALON) 100 MG capsule Take 1 capsule (100 mg total) by mouth every 8 (eight) hours. Patient not taking: Reported on 06/05/2015 09/24/13   Eber HongMiller, Brian, MD  HYDROcodone-acetaminophen (HYCET) 7.5-325 mg/15 ml solution Take 15 mLs by mouth every 8 (eight) hours as needed for moderate pain or severe pain (Do not drink or drive after taking.). 06/05/15   Antony MaduraHumes, Kelly, PA-C  ibuprofen (ADVIL,MOTRIN) 200 MG tablet Take 600 mg by mouth every 6 (six) hours as needed for moderate pain.    [provider]  naproxen (NAPROSYN) 500 MG tablet Take 1 tablet (500 mg total) by mouth 2 (two) times daily. 06/05/15   Antony MaduraHumes, Kelly, PA-C  Pseudoephedrine-APAP-DM (DAYQUIL MULTI-SYMPTOM COLD/FLU PO) Take 30 mLs by mouth every 6 (six) hours as needed (cough).    [provider]    Family History Family History  Problem Relation Age of Onset  . Heart disease Father   . Hypertension Father   . Hypertension Maternal Grandfather   . Heart disease Maternal Grandfather   . Lupus Mother   . Lupus Maternal Aunt   . Heart disease Maternal Grandmother   . COPD Paternal Grandmother        liver and lung  . Heart disease Paternal Grandfather   .  Other Neg Hx     Social History Social History  Substance Use Topics  . Smoking status: Heavy Tobacco Smoker    Packs/day: 0.50    Years: 16.00    Types: Cigarettes  . Smokeless tobacco: Never Used  . Alcohol use Yes     Comment: socially     Allergies   Patient has no known allergies.   Review of Systems Review of Systems  Constitutional: Negative for chills and fever.  Musculoskeletal: Positive for arthralgias, gait problem and joint swelling. Negative for back pain.  Neurological: Negative for numbness.   Physical  Exam Updated Vital Signs BP (!) 120/53 (BP Location: Right Arm)   Pulse 76   Temp 97.9 F (36.6 C) (Oral)   Resp 16   SpO2 95%   Physical Exam  Constitutional: She appears well-developed and well-nourished. No distress.  Cardiovascular: Normal rate.   Pulmonary/Chest: Effort normal. No respiratory distress.  Musculoskeletal: She exhibits no edema or deformity.       Left knee: She exhibits decreased range of motion. She exhibits no swelling, no effusion, no ecchymosis, no deformity, no laceration, no erythema, normal alignment, no LCL laxity, normal patellar mobility and no MCL laxity. Tenderness found. Medial joint line and lateral joint line tenderness noted. No MCL and no LCL tenderness noted.  No backers cyst felt, exam limited secondary to body habitus.   Neurological: No sensory deficit. She exhibits normal muscle tone.  Skin: Skin is warm and dry.  Psychiatric: She has a normal mood and affect. Her behavior is normal.  Nursing note and vitals reviewed.  ED Treatments / Results  Labs (all labs ordered are listed, but only abnormal results are displayed) Labs Reviewed - No data to display  EKG  EKG Interpretation None       Radiology Dg Knee Complete 4 Views Left  Result Date: 06/29/2016 CLINICAL DATA:  Pain and swelling over the last 3 weeks. No known injury. EXAM: LEFT KNEE - COMPLETE 4+ VIEW COMPARISON:  None. FINDINGS: Moderate size tori effusion. Osteoarthritis of the patellofemoral joint. Marginal osteophytes at the weight-bearing compartments without joint space narrowing. No other focal finding. IMPRESSION: Joint effusion. Osteoarthritis more pronounced at the patellofemoral joint than the weight-bearing compartments. Electronically Signed   By: Paulina Fusi M.D.   On: 06/29/2016 13:44    Procedures Procedures (including critical care time)  DIAGNOSTIC STUDIES: Oxygen Saturation is 98% on RA, normal by my interpretation.    COORDINATION OF CARE: 2:28 PM  Discussed treatment plan with pt at bedside and pt agreed to plan.  Medications Ordered in ED Medications - No data to display   Initial Impression / Assessment and Plan / ED Course  I have reviewed the triage vital signs and the nursing notes.  Pertinent labs & imaging results that were available during my care of the patient were reviewed by me and considered in my medical decision making (see chart for details).    Pt with mild swelling to the joint spaces, knee swelling, tightness in the knee, and mildly restricted range of motion. Pt unable to perform full flexion of the knee.  Pt is without systemic symptoms, erythema or redness of the joint consistent with gout or septic joint.  Patient X-Ray negative for obvious fracture or dislocation. Pain managed in ED. Pt advised to follow up with orthopedics if symptoms persist for further evaluation and treatment. Patient has brace so was not given new brace while in ED, conservative therapy recommended and discussed.  Patient will be dc home & is agreeable with above plan.  Patient was given the option to ask questions, all of which were answered to the best of my ability.    Final Clinical Impressions(s) / ED Diagnoses   Final diagnoses:  Effusion of left knee  Acute pain of left knee    New Prescriptions Discharge Medication List as of 06/29/2016  2:31 PM     I personally performed the services described in this documentation, which was scribed in my presence. The recorded information has been reviewed and is accurate.   Cristina Gong, PA-C 06/29/16 1715    Nira Conn, MD 07/03/16 [phone]

## 2016-06-29 NOTE — Discharge Instructions (Signed)
Please take Ibuprofen (Advil, motrin) and Tylenol (acetaminophen) to relieve your pain.  You may take up to 800 MG (4 pills) of normal strength ibuprofen every 8 hours as needed.  In between doses of ibuprofen you make take tylenol, up to 1,000 mg (two extra strength pills).  Do not take more than 3,000 mg tylenol in a 24 hour period.  Please check all medication labels as many medications such as pain and cold medications may contain tylenol.  Do not drink while taking these medications.  Do not take other NSAID'S while taking ibuprofen (such as aleve or naproxen).  Please take ibuprofen with food to decrease stomach upset.  ° °
